# Patient Record
Sex: Female | Born: 1940 | Race: White | Hispanic: No | Marital: Single | Smoking: Current every day smoker
Health system: Southern US, Community
[De-identification: ages and names within clinical notes are randomized; demographics above are authoritative.]

## PROBLEM LIST (undated history)

## (undated) DIAGNOSIS — D649 Anemia, unspecified: Secondary | ICD-10-CM

## (undated) DIAGNOSIS — I10 Essential (primary) hypertension: Secondary | ICD-10-CM

## (undated) DIAGNOSIS — I50810 Right heart failure, unspecified: Secondary | ICD-10-CM

## (undated) DIAGNOSIS — R001 Bradycardia, unspecified: Secondary | ICD-10-CM

## (undated) DIAGNOSIS — E049 Nontoxic goiter, unspecified: Secondary | ICD-10-CM

## (undated) DIAGNOSIS — Z720 Tobacco use: Secondary | ICD-10-CM

## (undated) DIAGNOSIS — C539 Malignant neoplasm of cervix uteri, unspecified: Secondary | ICD-10-CM

## (undated) DIAGNOSIS — I358 Other nonrheumatic aortic valve disorders: Secondary | ICD-10-CM

## (undated) DIAGNOSIS — M4046 Postural lordosis, lumbar region: Secondary | ICD-10-CM

## (undated) DIAGNOSIS — M4800 Spinal stenosis, site unspecified: Secondary | ICD-10-CM

## (undated) DIAGNOSIS — N1330 Unspecified hydronephrosis: Secondary | ICD-10-CM

## (undated) DIAGNOSIS — J449 Chronic obstructive pulmonary disease, unspecified: Secondary | ICD-10-CM

## (undated) DIAGNOSIS — I272 Pulmonary hypertension, unspecified: Secondary | ICD-10-CM

## (undated) HISTORY — PX: OTHER SURGICAL HISTORY: SHX169

## (undated) HISTORY — PX: ORIF ELBOW FRACTURE: SUR928

## (undated) HISTORY — PX: COLOSTOMY: SHX63

## (undated) HISTORY — PX: EYE SURGERY: SHX253

## (undated) HISTORY — PX: LAPAROTOMY: SHX154

---

## 2007-08-30 ENCOUNTER — Ambulatory Visit: Payer: Self-pay

## 2007-09-04 ENCOUNTER — Ambulatory Visit: Payer: Self-pay

## 2007-09-06 ENCOUNTER — Ambulatory Visit: Payer: Self-pay

## 2007-09-18 ENCOUNTER — Ambulatory Visit: Payer: Self-pay

## 2007-09-20 ENCOUNTER — Ambulatory Visit: Payer: Self-pay

## 2007-09-25 ENCOUNTER — Ambulatory Visit: Payer: Self-pay

## 2007-09-27 ENCOUNTER — Ambulatory Visit: Payer: Self-pay

## 2010-05-18 ENCOUNTER — Emergency Department
Admit: 2010-05-18 | Discharge: 2010-05-18 | Disposition: A | Discharge status: Home / Self Care | Payer: Self-pay | Admitting: Physician Assistant

## 2010-05-24 ENCOUNTER — Ambulatory Visit: Admission: RE | Admit: 2010-05-24 | Payer: Self-pay | Source: Ambulatory Visit | Admitting: EXTERNAL

## 2013-06-16 ENCOUNTER — Emergency Department (EMERGENCY_DEPARTMENT_HOSPITAL): Payer: Medicare Other | Admitting: UHP RADIOLOGY

## 2013-06-16 ENCOUNTER — Emergency Department (EMERGENCY_DEPARTMENT_HOSPITAL): Payer: Medicare Other

## 2013-06-16 ENCOUNTER — Emergency Department
Admission: EM | Admit: 2013-06-16 | Discharge: 2013-06-16 | Disposition: A | Discharge status: Other Acute Inpatient Hospital | Payer: Medicare Other | Attending: Emergency Medicine | Admitting: Emergency Medicine

## 2013-06-16 DIAGNOSIS — I10 Essential (primary) hypertension: Secondary | ICD-10-CM | POA: Insufficient documentation

## 2013-06-16 DIAGNOSIS — Z862 Personal history of diseases of the blood and blood-forming organs and certain disorders involving the immune mechanism: Secondary | ICD-10-CM | POA: Insufficient documentation

## 2013-06-16 DIAGNOSIS — R55 Syncope and collapse: Secondary | ICD-10-CM | POA: Insufficient documentation

## 2013-06-16 DIAGNOSIS — E8779 Other fluid overload: Secondary | ICD-10-CM | POA: Insufficient documentation

## 2013-06-16 HISTORY — DX: Anemia, unspecified: D64.9

## 2013-06-16 HISTORY — DX: Essential (primary) hypertension: I10

## 2013-06-16 LAB — COMPREHENSIVE METABOLIC PROFILE - BMC/JMC ONLY
ALBUMIN/GLOBULIN RATIO: 1.8
ALBUMIN: 3.6 g/dL (ref 3.5–5.0)
ALKALINE PHOSPHATASE: 75 IU/L (ref 38–126)
ALT (SGPT): 34 IU/L (ref 14–54)
ANION GAP: 5 mmol/L (ref 3–11)
AST (SGOT): 25 IU/L (ref 15–41)
BILIRUBIN, TOTAL: 1.2 mg/dL (ref 0.3–1.2)
BUN: 20 mg/dL (ref 6–20)
CALCIUM: 8.6 mg/dL — ABNORMAL LOW (ref 8.8–10.2)
CARBON DIOXIDE: 28 mmol/L (ref 22–32)
CHLORIDE: 106 mmol/L (ref 101–111)
CREATININE: 0.92 mg/dL (ref 0.44–1.00)
ESTIMATED GLOMERULAR FILTRATION RATE: 60 mL/min (ref >60)
GLUCOSE: 106 mg/dL (ref 70–110)
POTASSIUM: 4.9 mmol/L (ref 3.4–5.1)
SODIUM: 139 mmol/L (ref 136–145)
TOTAL PROTEIN: 5.5 g/dL — ABNORMAL LOW (ref 6.4–8.3)

## 2013-06-16 LAB — URINALYSIS - JMC ONLY
BILIRUBIN,URINE: NEGATIVE mg/dL
BLOOD,URINE: NEGATIVE
GLUCOSE,URINE: NORMAL mg/dL
KETONE, URINE: NEGATIVE mg/dL
LEUKOCYTE ESTERACE,URINE: NEGATIVE
NITRITES,URINE: NEGATIVE
PH,URINE: 6 (ref 5.0–7.5)
PROTEIN,URINE: NEGATIVE mg/dL
SPECIFIC GRAVITY,URINE: 1.005 (ref 1.005–1.020)
UROBILINOGEN,URINE: NORMAL mg/dL (ref 0.2–1.0)

## 2013-06-16 LAB — TYPE AND SCREEN - BMC/JMC ONLY
ABO/RH(D): AB POS
ANTIBODY SCREEN: NEGATIVE

## 2013-06-16 LAB — CBC
HCT: 24 % — ABNORMAL LOW (ref 36.0–48.0)
HGB: 6.6 g/dL — CL (ref 12.0–16.0)
MCH: 23 pg — ABNORMAL LOW (ref 28.3–34.3)
MCHC: 27.5 g/dL — ABNORMAL LOW (ref 32.0–36.0)
MCV: 83.4 fL (ref 82.0–100.0)
MPV: 9.2 fL (ref 7.4–10.45)
NRBC ABSOLUTE: 0.02 10*3/uL (ref 0–0.02)
NRBC: 0.1 /100{WBCs} (ref 0–0.3)
PLATELET COUNT: 343 K/uL (ref 150–400)
RBC: 2.88 M/uL — ABNORMAL LOW (ref 4.0–5.6)
RDW: 20.6 % — ABNORMAL HIGH (ref 11.0–16.0)
WBC: 12.9 10*3/uL — ABNORMAL HIGH (ref 4.0–11.0)

## 2013-06-16 LAB — MANUAL DIFFERENTIAL - BMC/JMC ONLY
BANDS: 4 % (ref 0–4)
BASOPHILS: 5 % — ABNORMAL HIGH (ref 0–2.5)
EOSINOPHIL: 2 % (ref 0.0–5.2)
LYMPHOCYTES: 4 % — ABNORMAL LOW (ref 15.0–43.0)
METAMYELOCYTES: 1 % (ref 0–1)
MONOCYTES: 3 % — ABNORMAL LOW (ref 4.8–12.0)
PMN'S: 81 % — ABNORMAL HIGH (ref 43.0–76.0)

## 2013-06-16 LAB — TROPONIN-I: TROPONIN-I: <0.02 ng/mL — ABNORMAL LOW (ref 0.02–0.06)

## 2013-06-16 LAB — PT/INR
INR NORMALIZED: 1.06
PROTHROMBIN TIME: 11.2 s — ABNORMAL HIGH (ref 9.8–11.1)

## 2013-06-16 LAB — RBC MORPHOLOGY - JMC ONLY: PLATELET ESTIMATE: ADEQUATE

## 2013-06-16 LAB — PTT (PARTIAL THROMBOPLASTIN TIME): APTT: 27.6 s (ref 23.3–30.0)

## 2013-06-16 LAB — B-TYPE NATRIURETIC PEPTIDE: B-TYPE NATRIURETIC PEPTIDE: 1641 pg/mL — ABNORMAL HIGH (ref 0–99)

## 2013-06-16 LAB — D-DIMER: D-DIMER (QUANT): 0.81 mg/L FEU — ABNORMAL HIGH (ref 0.19–0.53)

## 2013-06-16 MED ORDER — SODIUM CHLORIDE 0.9 % IV BOLUS
1000.0000 mL | INJECTION | Freq: Once | Status: DC
Start: 2013-06-16 — End: 2013-06-16
  Administered 2013-06-16: 0 mL via INTRAVENOUS
  Administered 2013-06-16: 1000 mL via INTRAVENOUS

## 2013-06-16 MED ORDER — FUROSEMIDE 10 MG/ML INJECTION SOLUTION
20.00 mg | INTRAMUSCULAR | Status: AC
Start: 2013-06-16 — End: 2013-06-16
  Administered 2013-06-16: 20 mg via INTRAVENOUS
  Filled 2013-06-16: qty 2

## 2013-06-16 MED ORDER — SODIUM CHLORIDE 0.9 % (FLUSH) INJECTION SYRINGE
2.5000 mL | INJECTION | INTRAMUSCULAR | Status: DC | PRN
Start: 2013-06-16 — End: 2013-06-16
  Filled 2013-06-16: qty 5

## 2013-06-16 MED ADMIN — sodium chloride 0.9 % (flush) injection syringe: 2.5 mL | INTRAVENOUS | NDC 63807010030

## 2013-06-16 NOTE — Ancillary Notes (Cosign Needed)
 Called ER back regarding admission, as labs ordered, but not resulted.  Will evaluate patient when workup complete.

## 2013-06-16 NOTE — ED Nurses Note (Signed)
 BSC placed in room. Daughter to remain at bedside. Pt assisted to [facility] w/o difficulty. Call bell within reach.

## 2013-06-16 NOTE — ED Nurses Note (Signed)
Pt departed facility for [facility] by [facility] per R. Chilton Si, Charity fundraiser.

## 2013-06-16 NOTE — ED Nurses Note (Signed)
Crackles LLL, IVF d/c'd , will inform provider.

## 2013-06-16 NOTE — ED Nurses Note (Signed)
Report called to Aldean Baker at [facility]. Pt and family aware of transfer and room assignment.

## 2013-06-16 NOTE — ED Nurses Note (Signed)
Lab work reviewed with pt and family. Questions answered.

## 2013-06-16 NOTE — ED Nurses Note (Signed)
 Pt HR 30-40's when falling asleep. This RN at bedside to assess. Now awake with HR in 60's. Plan to consult cardiology.

## 2013-06-16 NOTE — ED Nurses Note (Signed)
Pt brought in by family; pt was reportedly passing out at home residence. Pt ambulated to room without difficulty. During Neuro assessment pt began to seize. Provider called. Seizure lasted 10 seconds in which patient returned to normal state. Non post ictal at present. Lung fields with rhonchi throughout. Edema plus three to legs.

## 2013-06-16 NOTE — ED Nurses Note (Signed)
Plan for [facility] to see.

## 2013-06-16 NOTE — ED Provider Notes (Signed)
[facility]  Emergency Department     HISTORY OF PRESENT ILLNESS     Date:  06/16/2013  Patient's Name:  Katrina Reilly  Date of Birth:  30-Mar-1941    Patient is a 72 y.o. female presenting with reason unable to perform ROS.   History provided by:  Patient and relative  Other  Quality:  Syncopal Episode  Severity:  Mild  Onset quality:  Sudden  Duration:  1 hour  Timing:  Constant  Progression:  Unchanged  Chronicity:  Recurrent  Associated symptoms: no abdominal pain, no chest pain, no congestion, no diarrhea, no fever, no headaches, no nausea, no rash, no shortness of breath, no sore throat and no vomiting      72 Y/O FEMALE PRESENTING TO THE ED WITH REPORTS OF THREE SYNCOPAL EPISODES OVER THE PAST TWO WEEKS. PT'S FAMILY MEMBER REPORTS HER THIRD SYNCOPAL EPISODE WAS TODAY PTA. PT'S FAMILY MEMBER REPORTS SHE WITNESSED TODAY'S SYNCOPAL EPISODE, BUT NOT THE FIRST TWO. PT DENIES HITTING HER HEAD. PT'S FAMILY MEMBER REPORTS SHE HAS A HARD TIME STAYING AWAKE AS WELL. PT DENIES CP, DIZZINESS, OR ANY OTHER SYMPTOMS PRIOR TO HER SYNCOPAL EPISODES IN THE PAST. PT STS SHE DID FEEL DIZZINESS TODAY PRIOR TO SYNCOPE, BUT STS SHE WAS BENDING OVER PULLING WEEDS. PT REPORTS SHE CLENCHED HER FISTS PRIOR TO SYNCOPE EPISODE AS WELL PER HER SON. PT DID SEIZE IN ED FOR A FEW SECONDS TODAY. PT REPORTS SHE IS BACK AT BASELINE IN THE ED. PT HAS HX OF ANEMIA.     Review of Systems     Review of Systems   Constitutional: Negative for fever, chills and diaphoresis.   HENT: Negative for congestion, sore throat and neck pain.    Respiratory: Negative for shortness of breath.    Cardiovascular: Negative for chest pain and palpitations.   Gastrointestinal: Negative for nausea, vomiting, abdominal pain and diarrhea.   Genitourinary: Negative for dysuria.   Musculoskeletal: Negative for back pain.   Skin: Negative for rash.   Neurological: Positive for syncope. Negative for numbness and headaches.    Psychiatric/Behavioral: Negative for confusion.   All other systems reviewed and are negative.        Previous History     Past Medical History:  Past Medical History   Diagnosis Date   . HTN (hypertension)    . Anemia        Past Surgical History:  No past surgical history on file.    Social History:  History   Substance Use Topics   . Smoking status: Not on file   . Smokeless tobacco: Not on file   . Alcohol Use: Not on file     History   Drug Use Not on file       Family History:  No family history on file.    Medication History:  Current Outpatient Prescriptions   Medication Sig   . Hydroxyurea 200 mg Oral Capsule Take by mouth   . metoprolol (LOPRESSOR) 25 mg Oral Tablet Take 25 mg by mouth Twice daily       Allergies:  Allergies   Allergen Reactions   . Sulfa (Sulfonamides)        Physical Exam     Vitals:    BP 152/86   Pulse 54   Temp(Src) 36.8 C (98.3 F)   Resp 17   Ht 1.524 m (5')   Wt 129.729 kg (286 lb)   BMI 55.86 kg/m2  SpO2 98%    Physical Exam   Nursing note and vitals reviewed.  Constitutional:  Well developed, well nourished.  Awake & alert. No distress.  Head:  Obvious ecchymosis to left cheek.  Normocephalic.    Eyes:  PERRL.  EOMI.  Conjunctivae are not pale.  ENT:  Mucous membranes are dry and intact.  Oropharynx is clear and symmetric.  Patent airway.  Neck:  Supple.  Full ROM.  No JVD.  No lymphadenopathy.  Cardiovascular:  Regular rate.  Regular rhythm.  No murmurs, rubs, or gallops.  Distal pulses are 2+ and symmetric.  Pulmonary/Chest:  No evidence of respiratory distress.  Clear to auscultation bilaterally.  No wheezing, rales or rhonchi. Chest non-tender.  Abdominal:  Soft and non-distended.  There is no tenderness.  No rebound, guarding, or rigidity.  No organomegaly.  Good bowel sounds.    Back:  No CVA tenderness. FROM.   Extremities:  3+ pedem edema in bilateral lower legs.  No cyanosis.  No clubbing.  Full range of motion in all extremities.  No calf tenderness.   Skin:  Skin is warm and dry.  No diaphoresis. No rash.   Neurological:  Alert, awake, and appropriate.  Normal speech.  Sensation normal. Motor strengths 5/5. CN II-XII intact.   Psychiatric:  Good eye contact.  Normal interaction, affect, and behavior.    Diagnostic Studies/Treatment     Medications:  Medications   NS flush syringe (2.5 mL Intravenous Given 06/16/13 1502)   furosemide (LASIX) 10 mg/mL injection (20 mg Intravenous Given 06/16/13 1502)       New Prescriptions    No medications on file       Labs:    Results for orders placed during the hospital encounter of 06/16/13   CBC       Result Value Range    WBC 12.9 (*) 4.0 - 11.0 K/uL    RBC 2.88 (*) 4.0 - 5.6 M/uL    HGB 6.6 (*) 12.0 - 16.0 g/dL    HCT 16.1 (*) 09.6 - 48.0 %    MCV 83.4  82.0 - 100.0 fL    MCH 23.0 (*) 28.3 - 34.3 pg    MCHC 27.5 (*) 32.0 - 36.0 g/dL    RDW 04.5 (*) 40.9 - 16.0 %    PLATELET COUNT 343  150 - 400 K/uL    MPV 9.2  7.4 - 10.45 fL    NRBC 0.1  0 - 0.3 /100 WBC    NRBC ABSOLUTE 0.02  0 - 0.02 K/uL   COMPREHENSIVE METABOLIC PROFILE - [facility] ONLY       Result Value Range    GLUCOSE 106  70 - 110 mg/dL    BUN 20  6 - 20 mg/dL    CREATININE 8.11  9.14 - 1.00 mg/dL    ESTIMATED GLOMERULAR FILTRATION RATE 60  >60 ml/min    SODIUM 139  136 - 145 mmol/L    POTASSIUM 4.9  3.4 - 5.1 mmol/L    CHLORIDE 106  101 - 111 mmol/L    CARBON DIOXIDE 28  22 - 32 mmol/L    ANION GAP 5  3 - 11 mmol/L    CALCIUM 8.6 (*) 8.8 - 10.2 mg/dL    TOTAL PROTEIN 5.5 (*) 6.4 - 8.3 g/dL    ALBUMIN 3.6  3.5 - 5.0 g/dL    ALBUMIN/GLOBULIN RATIO 1.8      BILIRUBIN, TOTAL 1.2  0.3 - 1.2 mg/dL  AST (SGOT) 25  15 - 41 IU/L    ALT (SGPT) 34  14 - 54 IU/L    ALKALINE PHOSPHATASE 75  38 - 126 IU/L   TROPONIN-I       Result Value Range    TROPONIN-I <0.02 (*) 0.02 - 0.06 ng/mL   PT/INR       Result Value Range    PROTHROMBIN TIME 11.2 (*) 9.8 - 11.1 sec    INR NORMALIZED 1.06     PTT (PARTIAL THROMBOPLASTIN TIME)       Result Value Range    APTT 27.6  23.3 - 30.0 sec    D-DIMER       Result Value Range    D-DIMER (QUANT) 0.81 (*) 0.19 - 0.53 mg/L FEU   B-TYPE NATRIURETIC PEPTIDE       Result Value Range    B-TYPE NATRIURETIC PEPTIDE 1641 (*) 0 - 99 pg/mL   RBC MORPHOLOGY - [facility] ONLY       Result Value Range    PLATELET ESTIMATE ADEQUATE  ADEQUATE    HYPO 3+      POIKILOCYTOSIS 4+      BASOPHILIC STIPPILING 2+      ANISOCYTOSIS 4+      MICROCYTOSIS 3+      MACROCYTOSIS 1+      SCHISTOCYTES 1+      TARGET CELLS 1+      TEAR DROPS 1+      OVALOCYTES 1+      STOMATOCYTES 1+     MANUAL DIFFERENTIAL - [facility] ONLY       Result Value Range    METAMYELOCYTES 1  0 - 1 %    BANDS 4  0 - 4 %    PMN'S 81 (*) 43.0 - 76.0 %    LYMPHOCYTES 4 (*) 15.0 - 43.0 %    MONOCYTES 3 (*) 4.8 - 12.0 %    EOSINOPHIL 2  0.0 - 5.2 %    BASOPHILS 5 (*) 0 - 2.5 %   URINALYSIS - [facility] ONLY       Result Value Range    SOURCE, URINE CC      COLOR, URINE yellow  YELLOW    APPEARANCE,URINE clear  CLEAR    GLUCOSE,URINE normal  NEGATIVE mg/dL    BILIRUBIN,URINE negative  NEGATIVE mg/dL    KETONE, URINE negative  NEGATIVE mg/dL    SPECIFIC GRAVITY,URINE 1.005  1.005 - 1.020    BLOOD,URINE negative  NEGATIVE    PH,URINE 6.0  5.0 - 7.5    PROTEIN,URINE negative  NEGATIVE mg/dL    UROBILINOGEN,URINE normal  0.2 - 1.0 mg/dL    NITRITES,URINE negative  NEGATIVE    LEUKOCYTE ESTERACE,URINE negative  NEGATIVE   TYPE AND SCREEN - [facility] ONLY       Result Value Range    ABO/RH(D) AB POSITIVE      ANTIBODY SCREEN NEGATIVE         Radiology:  CT BRAIN WO IV CONTRAST  XR CHEST AP PORTABLE    CT BRAIN WO IV CONTRAST    ED Interpretation: Normal for age       XR CHEST AP PORTABLE    (Results Pending)   Imaging Studies: Imaging studies were ordered. Results contemporaneously interpreted by me:   XR CHEST: cardiomegaly      ECG:  EKG: The emergency physician ordered, reviewed, and independently interpreted the EKG. Dr. Marisa Sprinkles  Time Interpreted: 14:22  Rate:  65 bpm  Rhythm: NSR   Interpretation: Possible left atrial enlargement. Borderline EKG. No STEMI.     Cardiac Monitoring: 65 bpm    Procedure     Procedures    Course/Disposition/Plan     Course:    Concern for syncope noted and patient also having symptomatic bradycardia here as well.  There is concern for her elevated BNP as well and a CHF exacerbation. Her Hb is low but it is always low per the patient. D-dimer mildly elevated and will have to defer further scanning until her next facility at this time.  Overall presentation seems less likely to be a PE.  Patient transferred to [facility] for further eval. I spoke with Dr. Pecola Leisure of [facility] who recommended transfer to the hospitalist service.    Disposition:   Transfered to Another Facility    Follow up:   No follow-up provider specified.    Clinical Impression:     Encounter Diagnoses   Name Primary?   . Syncope Yes   . Fluid overload        Future Appointments Scheduled in Epic:  No future appointments.    SCRIBE ATTESTATION   This note is prepared by Pennie Banter, acting as Scribe for Dr. Marisa Sprinkles    The scribe's documentation has been prepared under my direction and personally reviewed by me in its entirety.  I confirm that the note above accurately reflects all work, treatment, procedures, and medical decision making performed by me, Dr. Marisa Sprinkles

## 2014-04-14 ENCOUNTER — Emergency Department
Admission: EM | Admit: 2014-04-14 | Discharge: 2014-04-14 | Disposition: A | Discharge status: Home / Self Care | Payer: Medicare Other | Attending: Emergency Medicine | Admitting: Emergency Medicine

## 2014-04-14 ENCOUNTER — Emergency Department (EMERGENCY_DEPARTMENT_HOSPITAL): Payer: Medicare Other

## 2014-04-14 DIAGNOSIS — S8000XA Contusion of unspecified knee, initial encounter: Secondary | ICD-10-CM | POA: Insufficient documentation

## 2014-04-14 DIAGNOSIS — S8002XA Contusion of left knee, initial encounter: Secondary | ICD-10-CM

## 2014-04-14 DIAGNOSIS — Y9301 Activity, walking, marching and hiking: Secondary | ICD-10-CM | POA: Insufficient documentation

## 2014-04-14 DIAGNOSIS — D649 Anemia, unspecified: Secondary | ICD-10-CM | POA: Insufficient documentation

## 2014-04-14 DIAGNOSIS — W010XXA Fall on same level from slipping, tripping and stumbling without subsequent striking against object, initial encounter: Secondary | ICD-10-CM

## 2014-04-14 DIAGNOSIS — I10 Essential (primary) hypertension: Secondary | ICD-10-CM | POA: Insufficient documentation

## 2014-04-14 DIAGNOSIS — F17200 Nicotine dependence, unspecified, uncomplicated: Secondary | ICD-10-CM | POA: Insufficient documentation

## 2014-04-14 MED ORDER — HYDROCODONE 5 MG-ACETAMINOPHEN 325 MG TABLET
ORAL_TABLET | ORAL | Status: DC
Start: 2014-04-14 — End: 2014-04-14
  Filled 2014-04-14: qty 1

## 2014-04-14 MED ORDER — HYDROCODONE 5 MG-ACETAMINOPHEN 325 MG TABLET
1.00 | ORAL_TABLET | ORAL | Status: AC
Start: 2014-04-14 — End: 2014-04-14
  Administered 2014-04-14: 1 via ORAL

## 2014-04-14 MED ORDER — HYDROCODONE 5 MG-ACETAMINOPHEN 325 MG TABLET
1.00 | ORAL_TABLET | Freq: Four times a day (QID) | ORAL | Status: AC | PRN
Start: 2014-04-14 — End: ?

## 2014-04-14 MED ADMIN — HYDROcodone 5 mg-acetaminophen 325 mg tablet: ORAL | @ 13:00:00

## 2014-04-14 NOTE — ED Nurses Note (Signed)
Left knee wrapped with ace wrap per order. Pt tolerated without complaint.Patient discharged home with family.  AVS reviewed with patient/care giver.  A written copy of the AVS and discharge instructions was given to the patient/care giver.  Questions sufficiently answered as needed.  Patient/care giver encouraged to follow up with PCP as indicated.  In the event of an emergency, patient/care giver instructed to call 911 or go to the nearest emergency room. Ambulatory home with family, questions denied.

## 2014-04-14 NOTE — ED Provider Notes (Signed)
[facility]  Emergency Department     HISTORY OF PRESENT ILLNESS     Date:  04/14/2014  Patient's Name:  Katrina Reilly  Date of Birth:  1941-02-12    Patient is a 73 y.o. female presenting with fall.   History provided by:  Patient  Fall  The accident occurred 1 to 2 hours ago. The fall occurred while walking. She fell from a height of 1 to 2 ft. She landed on carpet. There was no blood loss. The point of impact was the left knee. The pain is present in the left knee. The pain is at a severity of 8/10. The pain is moderate. Pertinent negatives include no fever, no abdominal pain, no nausea, no vomiting, no headaches and no loss of consciousness.        72 Y/O FEMALE PRESENTS TO ED AFTER A FALL. THE PT REPORTS THAT SHE TRIPPED AND FELL WHILE WALKING TO THE BATHROOM THIS MORNING. THE PT STS THAT SHE LANDED ONTO HER KNEES BEFORE FALLING FORWARD ONTO HER ARMS. THE PT STS THAT SHE HAS HAD PAIN IN HER LEFT KNEE SINCE FALLING. THE PT STS THAT HER PAIN IS EXACERBATED WITH WEIGHT BEARING. THE PT DENIES ANY HEAD INJURY, LOC, OR OTHER ASSOCIATED INJURIES.     Review of Systems     Review of Systems   Constitutional: Negative for fever and diaphoresis.   HENT: Negative for congestion and sore throat.    Respiratory: Negative for cough and shortness of breath.    Cardiovascular: Negative for chest pain.   Gastrointestinal: Negative for nausea, vomiting, abdominal pain and diarrhea.   Musculoskeletal: Positive for myalgias. Negative for back pain and neck pain.   Skin: Negative for rash.   Neurological: Negative for loss of consciousness, syncope and headaches.   Psychiatric/Behavioral: Negative for confusion.   All other systems reviewed and are negative.        Previous History     Past Medical History:  Past Medical History   Diagnosis Date    HTN (hypertension)     Anemia        Past Surgical History:  History reviewed. No pertinent past surgical history.    Social History:  History      Substance Use Topics    Smoking status: Current Every Day Smoker     Types: Cigarettes    Smokeless tobacco: Not on file    Alcohol Use: Yes      Comment: social     History   Drug Use No       Family History:  No family history on file.    Medication History:  Current Outpatient Prescriptions   Medication Sig    HYDROcodone-acetaminophen (NORCO) 5-325 mg Oral Tablet Take 1 Tab by mouth Every 6 hours as needed for Pain    Hydroxyurea 200 mg Oral Capsule Take by mouth       Allergies:  Allergies   Allergen Reactions    Sulfa (Sulfonamides)        Physical Exam     Vitals:    BP 167/77    Pulse 90    Temp(Src) 36.6 C (97.9 F)    Resp 18    Ht 1.575 m (5' 2.01")    Wt 113.399 kg (250 lb)    BMI 45.71 kg/m2      SpO2 96%     Physical Exam   Nursing note and vitals reviewed.    Constitutional:  Well  developed, well nourished.  Awake & alert. No distress.  Head:  Atraumatic.  Normocephalic.    Cardiovascular:  Regular rate.  Regular rhythm.  No murmurs, rubs, or gallops.  Distal pulses are 2+ and symmetric.  Pulmonary/Chest:  No evidence of respiratory distress.  Clear to auscultation bilaterally.  No wheezing, rales or rhonchi. Chest non-tender.  Abdominal:  Soft and non-distended.  There is no tenderness.  No rebound, guarding, or rigidity.  No organomegaly.  Good bowel sounds.    Back:  No CVA tenderness. FROM.   Extremities:  No edema.   No cyanosis.  No clubbing.  Full range of motion in all extremities.  No calf tenderness. Pain to palpation of the left knee with ecchymosis and edema. No deformity.   Skin:  Skin is warm and dry.  No diaphoresis. No rash.   Neurological:  Alert, awake, and appropriate.  Normal speech.  Sensation normal.   Psychiatric:  Good eye contact.  Normal interaction, affect, and behavior.  Diagnostic Studies/Treatment     Medications:  Medications   HYDROcodone-acetaminophen (NORCO) 5-325 mg per tablet (1 Tab Oral Given 04/14/14 1257)       New Prescriptions     HYDROCODONE-ACETAMINOPHEN (NORCO) 5-325 MG ORAL TABLET    Take 1 Tab by mouth Every 6 hours as needed for Pain       Labs:    No results found for any visits on 04/14/14.    Radiology:  XR KNEE LEFT AP, LAT & 1 OBLIQUE    XR KNEE LEFT AP, LAT & 1 OBLIQUE    (Results Pending)   Imaging Studies: Imaging studies were ordered. Results contemporaneously interpreted by me:   XR Left knee: Soft tissue hematoma. No fracture or dislocation.     ECG:  NONE      Procedure     Procedures    Course/Disposition/Plan     Course:    No fx, placed in acewrap, given Hydrocodone, refused crutches.    Disposition:    Discharged    Follow up:   [facility]  [address]  [place] [id number]  [phone]    Schedule an appointment as soon as possible for a visit  As needed      Clinical Impression:     Encounter Diagnosis   Name Primary?    Contusion of left knee Yes       Future Appointments Scheduled in Epic:  No future appointments.     SCRIBE ATTESTATION   This note is prepared by Luvenia HellerSamantha Chandler, acting as Scribe for Dr. Ramiro HarvestParikh    The scribe's documentation has been prepared under my direction and personally reviewed by me in its entirety.  I confirm that the note above accurately reflects all work, treatment, procedures, and medical decision making performed by me, Dr. Ramiro HarvestParikh

## 2014-04-14 NOTE — ED Nurses Note (Signed)
Pt was ambulating to restroom this am, tripped and fell.  Pain to left lower leg, minor swelling noted to bottom of leg.  Bruising to knee.

## 2014-04-16 ENCOUNTER — Ambulatory Visit (INDEPENDENT_AMBULATORY_CARE_PROVIDER_SITE_OTHER): Payer: Medicare Other

## 2014-04-16 ENCOUNTER — Encounter (INDEPENDENT_AMBULATORY_CARE_PROVIDER_SITE_OTHER): Payer: Self-pay

## 2014-04-16 VITALS — BP 142/74 | HR 68 | Temp 98.4°F | Wt 179.0 lb

## 2014-04-16 DIAGNOSIS — L0291 Cutaneous abscess, unspecified: Secondary | ICD-10-CM

## 2014-04-16 DIAGNOSIS — L0390 Cellulitis, unspecified: Secondary | ICD-10-CM

## 2014-04-16 DIAGNOSIS — S8000XA Contusion of unspecified knee, initial encounter: Secondary | ICD-10-CM

## 2014-04-16 MED ORDER — CEPHALEXIN 500 MG CAPSULE
500.00 mg | ORAL_CAPSULE | Freq: Two times a day (BID) | ORAL | Status: AC
Start: 2014-04-16 — End: 2014-04-23

## 2014-04-16 NOTE — Progress Notes (Signed)
BP 142/74   Pulse 68   Temp(Src) 36.9 C (98.4 F) (Oral)   Wt 81.194 kg (179 lb)   Katrina Reilly, RTR  04/16/2014, 08:27

## 2014-04-16 NOTE — Progress Notes (Signed)
ID: [name]   DOB: 1940-11-10  Date of Service: 04/16/2014     Chief Complaint(s):   Chief Complaint   Patient presents with    Blister     left knee    Bruising     left knee       Subjective:      Fall on left knee on Monday, seen in ED.  Xray negative but since that time has had a large vesicle form on the anterior patella and she states has gotten red and hot.  Still with pain.  No fever.  No systemic symptoms.  Allergy to sulfa.  Tolerating po.  Ambulating with a cane    Current Outpatient Prescriptions   Medication Sig    cephALEXin (KEFLEX) 500 mg Oral Capsule Take 1 Cap (500 mg total) by mouth Twice daily for 7 days    furosemide (LASIX) 40 mg Oral Tablet Take 40 mg by mouth Once a day    HYDROcodone-acetaminophen (NORCO) 5-325 mg Oral Tablet Take 1 Tab by mouth Every 6 hours as needed for Pain    Hydroxyurea 200 mg Oral Capsule Take by mouth       Allergies   Allergen Reactions    Sulfa (Sulfonamides)        No family history on file.    Active Ambulatory Problems     Diagnosis Date Noted    No Active Ambulatory Problems     Resolved Ambulatory Problems     Diagnosis Date Noted    No Resolved Ambulatory Problems     Past Medical History   Diagnosis Date    HTN (hypertension)     Anemia        Social History        Objective:  Vitals Signs are stable   Filed Vitals:    04/16/14 0825   BP: 142/74   Pulse: 68   Temp: 36.9 C (98.4 F)   TempSrc: Oral   Weight: 81.194 kg (179 lb)       Gen:  no acute distress, non toxic looking, interactive  Extremity:  Erythema of the left knee as well as 8x8 cm ecchymosis with tenderness and warmth, large bullae with clear fluid on anterior knee/patella, mild increased warmth with some tenderness in the area of erythema.  The entire region appears to be mildly edematous as well.     Clinical Impression:           ICD-9-CM   1. Knee contusion 924.11   2. Cellulitis 682.9       Orders Placed This Encounter    cephALEXin (KEFLEX) 500 mg Oral Capsule                  Plan:   1)   Will cover with antibiotics as above. Medication given with warnings including yogurt and probiotic use for prophylaxis against C. difficile. Allergic reaction discussed. ICE vs heat discussed.  Return if worse at any time.     If symptoms worsen over the next 24-36 hours at any time they should proceed immediately to the emergency department as the injury may need IV antibiotics. Would expect to see some improvement over that time period     At this time I recommend symptomatic care, and Tylenol Motrin.    Follow up with us immediately if patient worsens or status changes significantly otherwise it should run benign course.     All questions were answered and patient agrees to plan  Level III

## 2014-04-16 NOTE — Patient Instructions (Signed)
Cellulitis  Cellulitis is an infection of the skin and the tissue beneath it. The infected area is usually red and tender. Cellulitis occurs most often in the arms and lower legs.   CAUSES   Cellulitis is caused by bacteria that enter the skin through cracks or cuts in the skin. The most common types of bacteria that cause cellulitis are staphylococci and streptococci.  SIGNS AND SYMPTOMS    Redness and warmth.   Swelling.   Tenderness or pain.   Fever.  DIAGNOSIS   Your health care provider can usually determine what is wrong based on a physical exam. Blood tests may also be done.  TREATMENT   Treatment usually involves taking an antibiotic medicine.  HOME CARE INSTRUCTIONS    Take your antibiotic medicine as directed by your health care provider. Finish the antibiotic even if you start to feel better.   Keep the infected arm or leg elevated to reduce swelling.   Apply a warm cloth to the affected area up to 4 times per day to relieve pain.   Take medicines only as directed by your health care provider.   Keep all follow-up visits as directed by your health care provider.  SEEK MEDICAL CARE IF:    You notice red streaks coming from the infected area.   Your red area gets larger or turns dark in color.   Your bone or joint underneath the infected area becomes painful after the skin has healed.   Your infection returns in the same area or another area.   You notice a swollen bump in the infected area.   You develop new symptoms.   You have a fever.  SEEK IMMEDIATE MEDICAL CARE IF:    You feel very sleepy.   You develop vomiting or diarrhea.   You have a general ill feeling (malaise) with muscle aches and pains.  MAKE SURE YOU:    Understand these instructions.   Will watch your condition.   Will get help right away if you are not doing well or get worse.  Document Released: 07/13/2005 Document Revised: 02/17/2014 Document Reviewed: 12/19/2011  [facility] Patient Information 2015 [employer] [place] [place].  This information is not intended to replace advice given to you by your health care provider. Make sure you discuss any questions you have with your health care provider.  [facility]  [address], [address]  [place], [place] [id number]  Phone: [phone]  Fax: [phone]               Open Daily 12:00pm - 8:00pm ~ Closed Thanksgiving and Christmas Day     Attending Caregiver: Providence CrosbyLloyd R Myrtha Tonkovich, MD      Today's orders:   Orders Placed This Encounter    cephALEXin (KEFLEX) 500 mg Oral Capsule        If you have been given and antibiotic, we recommend eating yogurt or taking PROBIOTIC pills to decrease the risk of antibiotic induced diarrhea and a resulting infection called C Difficile which can be dangerous/deadly.          Prescription(s) E-Rx to:  [id number] [place] [address]    ________________________________________________________________________  Short Term Disability and Family Medical Leave Act  [facility] does NOT provide assistance with any disability applications.  If you feel your medical condition requires you to be on disability, you will need to follow up with  Your primary care physician or a specialist.  We apologize  for any inconvenience.    For Medication Prescribed by [facility]:  As an Urgent Care facility, our clinic does NOT offer prescription refills over the telephone.    If you need more of the medication one of our medical providers prescribed, you will  Either need to be re-evaluated by us or see your primary care physician.    ________________________________________________________________________      It is very important that we have a phone number that is the single best way to contact you in the event that we become aware of important clinical information or concerns after your discharge.  If the phone number you provided at registration is NOT this number you should inform staff and registration prior to leaving.      Your  treatment and evaluation today was focused on identifying and treating potentially emergent conditions based on your presenting signs, symptoms, and history.  The resulting initial clinical impression and treatment plan is not intended to be definitive or a substitute for a full physical examination and evaluation by your primary care provider.  If your symptoms persist, worsen, or you develop any new or concerning symptoms, you need to be evaluated.      If you received x-rays during your visit, be aware that the final and formal interpretation of those films by a radiologist may occur after your discharge.  If there is a significant discrepancy identified after your discharge, we will contact you at the telephone number provided at registration.      If you received a pelvic exam, you may have cultures pending for sexually transmitted diseases.  Positive cultures are reported to the [facility] Department of Health as required by state law.  You should be contacted if you cultures are positive.  We will not contact you if they are negative.  You did NOT receive a PAP smear (the screening test for cervical).  This specific test for women is best performed by your gynecologist or primary care provider when indicated.      If you are over 75 year old, we cannot discuss your personal health information with a parent, spouse, family member, or anyone else without your express consent.  This does not include those who have legitimate access to your records and information to assist in your care under the provisions of HIPAA [facility](Health Insurance Portability and Accountability Act) law, or those to whom you have previously given express written consent to do so, such a legal guardian or Power of [place].      You may have received medication that may cause you to feel drowsy and/or light headed for several hours.  You may even experience some amnesia of your stay.  You should avoid operating a motor vehicle or performing any  activity requiring complete alertness or coordination until you feel fully awake (approximately 24-48 hours).  Avoid alcoholic beverages.  You may also have a dry mouth for several hours.  This is a normal side effect and will disappear as the effects of the medication wear off.      Instructions discussed with patient upon discharge by clinical staff with all questions answered.  Please call [facility] [phone]) if any further questions.  Go immediately to the emergency department if any concern or worsening symptoms.      Providence CrosbyLloyd R Chantee Cerino, MD 04/16/2014, 08:42

## 2014-04-17 ENCOUNTER — Encounter (INDEPENDENT_AMBULATORY_CARE_PROVIDER_SITE_OTHER): Payer: Self-pay

## 2014-04-17 NOTE — Progress Notes (Signed)
Patient does not answer telephone on courtesy call back.  alp

## 2015-02-02 DIAGNOSIS — K5720 Diverticulitis of large intestine with perforation and abscess without bleeding: Principal | ICD-10-CM

## 2015-02-02 NOTE — ED Notes (Signed)
Pt started having > 10/10 pain upon awakening this morning and went to [facility] ED, where she was diagnosed with a perforated bowel. They were not able to repair it surgically at that facility so she was sent here for further treatment and evaluation. Pt states the pain is mostly bilateral lower quadrants however it does radiant up. Pt states she had a BM last night of all diarrhea. Pt states today she had a bagel for breakfast however soon after she had nausea with emesis. Pt has diffuse abdominal tenderness. Bowel sounds normoactive in bilateral upper quadrants, and hypoactive bilateral lower quadrants. Pt is complaining of 10/10 pain currently. Pt is in mild distress from pain. Pt is supine in bed with HOB elevated approx 45* for comfort. Bed in lowest locked position, side rails x2 up, call light in reach. Pt connected to cardiac monitor/BP cuff/pulse oximeter.

## 2015-02-02 NOTE — ED Notes (Signed)
TRANSFER - OUT REPORT:    Verbal report given to Joletta RN(name) on Hurshel PartyLinda Bentz  being transferred to PCCU(unit) for routine progression of care       Report consisted of patient???s Situation, Background, Assessment and   Recommendations(SBAR).     Information from the following report(s) SBAR, ED Summary, Recent Results and Med Rec Status was reviewed with the receiving nurse.    Lines:   Peripheral IV 02/02/15 Left Forearm (Active)   Site Assessment Clean, dry, & intact 02/02/2015 10:18 PM   Phlebitis Assessment 0 02/02/2015 10:18 PM   Dressing Status Clean, dry, & intact 02/02/2015 10:18 PM   Dressing Type Transparent 02/02/2015 10:18 PM   Hub Color/Line Status Flushed;Patent 02/02/2015 10:18 PM       Peripheral IV 02/02/15 Left Antecubital (Active)   Site Assessment Clean, dry, & intact 02/02/2015 10:18 PM   Phlebitis Assessment 0 02/02/2015 10:18 PM   Infiltration Assessment 0 02/02/2015 10:18 PM   Dressing Status Clean, dry, & intact 02/02/2015 10:18 PM   Dressing Type Transparent 02/02/2015 10:18 PM   Hub Color/Line Status Flushed;Patent 02/02/2015 10:18 PM        Opportunity for questions and clarification was provided.      Patient transported with:   Monitor  O2 @ 2 liters  Registered Nurse

## 2015-02-03 ENCOUNTER — Inpatient Hospital Stay
Admit: 2015-02-03 | Discharge: 2015-02-10 | Disposition: A | Discharge status: Home Health | Payer: MEDICARE | Attending: Internal Medicine | Admitting: Internal Medicine

## 2015-02-03 LAB — METABOLIC PANEL, COMPREHENSIVE
ALT (SGPT): 15 U/L (ref 12–78)
AST (SGOT): 16 U/L (ref 15–37)
Albumin: 3.4 gm/dl (ref 3.4–5.0)
Alk. phosphatase: 79 U/L (ref 45–117)
BUN: 16 mg/dl (ref 7–25)
Bilirubin, total: 0.8 mg/dl (ref 0.2–1.0)
CO2: 25 mEq/L (ref 21–32)
Calcium: 7.9 mg/dl — ABNORMAL LOW (ref 8.5–10.1)
Chloride: 108 mEq/L — ABNORMAL HIGH (ref 98–107)
Creatinine: 1.2 mg/dl (ref 0.6–1.3)
GFR est AA: 56
GFR est non-AA: 47
Glucose: 134 mg/dl — ABNORMAL HIGH (ref 74–106)
Potassium: 4.5 mEq/L (ref 3.5–5.1)
Protein, total: 6.3 gm/dl — ABNORMAL LOW (ref 6.4–8.2)
Sodium: 141 mEq/L (ref 136–145)

## 2015-02-03 LAB — CBC W/O DIFF
HCT: 30.3 % — ABNORMAL LOW (ref 37.0–50.0)
HGB: 8.8 gm/dl — ABNORMAL LOW (ref 13.0–17.2)
MCH: 34.9 pg — ABNORMAL HIGH (ref 25.4–34.6)
MCHC: 29 gm/dl — ABNORMAL LOW (ref 30.0–36.0)
MCV: 120.2 fL — ABNORMAL HIGH (ref 80.0–98.0)
MPV: 11.8 fL — ABNORMAL HIGH (ref 6.0–10.0)
PLATELET: 435 10*3/uL (ref 140–450)
RBC: 2.52 M/uL — ABNORMAL LOW (ref 3.60–5.20)
RDW-SD: 80.8 — CR (ref 36.4–46.3)
WBC: 43 10*3/uL — ABNORMAL HIGH (ref 4.0–11.0)

## 2015-02-03 LAB — HGB & HCT
HCT: 28.6 % — ABNORMAL LOW (ref 37.0–50.0)
HCT: 28.9 % — ABNORMAL LOW (ref 37.0–50.0)
HCT: 27.8 % — ABNORMAL LOW (ref 37.0–50.0)
HGB: 8.3 gm/dl — ABNORMAL LOW (ref 13.0–17.2)
HGB: 8.3 gm/dl — ABNORMAL LOW (ref 13.0–17.2)
HGB: 7.9 gm/dl — ABNORMAL LOW (ref 13.0–17.2)

## 2015-02-03 LAB — IRON PROFILE
Iron % saturation: 2 % — ABNORMAL LOW (ref 20–45)
Iron: <5 ug/dL — ABNORMAL LOW (ref 50–170)
TIBC: 265 ug/dL (ref 250–450)

## 2015-02-03 LAB — LACTIC ACID
Lactic Acid: 1.1 mmol/L (ref 0.4–2.0)
Lactic Acid: 0.8 mmol/L (ref 0.4–2.0)

## 2015-02-03 LAB — GLUCOSE, POC: Glucose (POC): 141 mg/dL — ABNORMAL HIGH (ref 65–105)

## 2015-02-03 LAB — BLOOD TYPE, (ABO+RH)
ABO/Rh(D): AB POS
ABO/Rh: AB POS

## 2015-02-03 LAB — ANTIBODY SCREEN: Antibody screen: NEGATIVE

## 2015-02-03 MED ORDER — MORPHINE 4 MG/ML SYRINGE
4 mg/mL | INTRAMUSCULAR | Status: AC
Start: 2015-02-03 — End: 2015-02-02
  Administered 2015-02-03: 03:00:00 via INTRAVENOUS

## 2015-02-03 MED ORDER — ONDANSETRON (PF) 4 MG/2 ML INJECTION
4 mg/2 mL | Freq: Once | INTRAMUSCULAR | Status: AC
Start: 2015-02-03 — End: 2015-02-02
  Administered 2015-02-03: 03:00:00 via INTRAVENOUS

## 2015-02-03 MED ORDER — MORPHINE 4 MG/ML SYRINGE
4 mg/mL | INTRAMUSCULAR | Status: DC | PRN
Start: 2015-02-03 — End: 2015-02-04
  Administered 2015-02-04: 01:00:00 via INTRAVENOUS

## 2015-02-03 MED ORDER — ONDANSETRON (PF) 4 MG/2 ML INJECTION
4 mg/2 mL | INTRAMUSCULAR | Status: DC | PRN
Start: 2015-02-03 — End: 2015-02-10

## 2015-02-03 MED ORDER — SODIUM CHLORIDE 0.9 % IV
INTRAVENOUS | Status: AC
Start: 2015-02-03 — End: 2015-02-03

## 2015-02-03 MED ORDER — HYDROMORPHONE (PF) 1 MG/ML IJ SOLN
1 mg/mL | INTRAMUSCULAR | Status: DC | PRN
Start: 2015-02-03 — End: 2015-02-03

## 2015-02-03 MED ORDER — ALBUTEROL SULFATE 2.5 MG/0.5 ML NEB SOLUTION
2.5 mg/0.5 mL | RESPIRATORY_TRACT | Status: DC | PRN
Start: 2015-02-03 — End: 2015-02-10

## 2015-02-03 MED ORDER — SODIUM CHLORIDE 0.9 % IV
INTRAVENOUS | Status: DC
Start: 2015-02-03 — End: 2015-02-03
  Administered 2015-02-03: 03:00:00 via INTRAVENOUS

## 2015-02-03 MED ORDER — IPRATROPIUM BROMIDE 0.02 % SOLN FOR INHALATION
0.02 % | Freq: Four times a day (QID) | RESPIRATORY_TRACT | Status: DC | PRN
Start: 2015-02-03 — End: 2015-02-10

## 2015-02-03 MED ORDER — SODIUM CHLORIDE 0.9 % IV
INTRAVENOUS | Status: DC
Start: 2015-02-03 — End: 2015-02-05
  Administered 2015-02-03 – 2015-02-05 (×4): via INTRAVENOUS

## 2015-02-03 MED ORDER — ERTAPENEM 1 GRAM SOLUTION FOR INJECTION
1 gram | INTRAMUSCULAR | Status: DC
Start: 2015-02-03 — End: 2015-02-06
  Administered 2015-02-03 – 2015-02-06 (×4): via INTRAVENOUS

## 2015-02-03 MED ORDER — HYDROMORPHONE (PF) 1 MG/ML IJ SOLN
1 mg/mL | INTRAMUSCULAR | Status: DC | PRN
Start: 2015-02-03 — End: 2015-02-09
  Administered 2015-02-04 – 2015-02-08 (×13): via INTRAVENOUS

## 2015-02-03 MED ORDER — PANTOPRAZOLE 40 MG IV SOLR
40 mg | Freq: Two times a day (BID) | INTRAVENOUS | Status: DC
Start: 2015-02-03 — End: 2015-02-08
  Administered 2015-02-03 – 2015-02-08 (×11): via INTRAVENOUS

## 2015-02-03 MED FILL — MORPHINE 4 MG/ML SYRINGE: 4 mg/mL | INTRAMUSCULAR | Qty: 1

## 2015-02-03 MED FILL — SODIUM CHLORIDE 0.9 % IV: INTRAVENOUS | Qty: 1000

## 2015-02-03 MED FILL — INVANZ 1 GRAM SOLUTION FOR INJECTION: 1 gram | INTRAMUSCULAR | Qty: 1

## 2015-02-03 MED FILL — ONDANSETRON (PF) 4 MG/2 ML INJECTION: 4 mg/2 mL | INTRAMUSCULAR | Qty: 2

## 2015-02-03 MED FILL — PROTONIX 40 MG INTRAVENOUS SOLUTION: 40 mg | INTRAVENOUS | Qty: 40

## 2015-02-03 NOTE — Consults (Addendum)
[facility]  Colon and Rectal Surgery  [phone]    CONSULT NOTE    Today's date: February 03, 2015  Patient: Joanne Ruiz                 Sex: female        Date of Birth:  Dec 25, 1940      Age:  74 y.o.          DOA: 02/02/2015  LOS:  LOS: 1 day         Assessment/Plan     Active Problems:    Perforated viscus (02/02/2015)    1.  Perforated viscus, likely but not definitively diverticulitis.  Very very scant pneumoperitoneum on CT scan (several tiny bubbles.)  Hemodynamically stable.  Pain already improving with IV antibiotics and bowel rest.  No plan for urgent operative intervention- this is very likely to resolve non-operatively.  Continue IV abx (ertapenem appropriate), bowel rest, and judicious pain control.  Should eventually have colonoscopy as outpatient (~6 weeks)    2.  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  She sees a hematologist about twice a year for this, but doesn't remember who.  Will try to contact her PCP (Dr. Richardson Dopp in [place]) for more info, name of oncologist, and baseline CBC.  With ET there is a risk of leukemoid transformation as well as development of acquired Von Willebrand's- need to find out her current status- crucial information should surgery eventually become necessary.  May need hematology consult here.  ADDENDUM:    - Spoke with patient's PCP, Dr. Lavada Mesi [phone]).  She reports patient has chronic iron deficiency anemia, requiring intermittent IV iron infusions, refuses or doesn't tolerate po iron.  Has refused endoscopy/colonoscopy in workup.  Also has recently increasing WBC (since October 2015), ranging 15-20K.    - Left message with patient's hematologist, Dr. Lazarus Salines ([place]).    3. COPD, O2 dependent- mgmt per hospitalist.  4. Pulmonary hypertension- mgmt per hospitalist.  5. Aortic sclerosis - mgmt per hospitalist.   6. History of cervical cancer, s/p chemo/EBRT about 10 years ago - accordign           Discussed with patient and RN at bedside.    HPI:     Joanne Ruiz is a 74 y.o. female with multiple medical problems, who awoke yesterday am with deep, low LLQ pain which worsened with time and became more diffuse.  She did have one episode of diarrhea.  She presented to [facility] ED, where her WBC was 33 and a CT scan showed tiny amounts of free air and possible/subtle diverticulitis.  Due to comorbidities she was transferred here for further care. She has received ertapenem and IV hydration.  She reports feeling better today than yesterday, though still with pain.  No nausea or vomiting, no prodrome, no fever/chills/sweats.  She has never had similar symptoms.  She does acknowledge a colonoscopy years ago, unsure whether she had polyps or diverticula noted.  She has never had abdominal surgery.  She did have pelvic XRT with chemo for cervical cancer about 10 years ago, no recent follow up.    Past Medical History   Diagnosis Date   ??? Chronic obstructive pulmonary disease ([place])    ??? Hypertension    ??? Anemia    ??? Thrombocytosis ([place])    ??? Cervical cancer ([place])    ??? Spinal stenosis    ??? Hydronephrosis    ??? Osteoporosis    ???  Detached retina    ??? Goiter    ??? Pulmonary HTN ([place])    ??? Nocturnal hypoxia    ??? Aortic sclerosis [facility])        Past Surgical History   Procedure Laterality Date   ??? Hx open reduction internal fixation Left Elbow   ??? Hx other surgical  Eye Surgery       History reviewed. No pertinent family history.    History     Social History   ??? Marital Status: SINGLE     Spouse Name: N/A   ??? Number of Children: N/A   ??? Years of Education: N/A     Social History Main Topics   ??? Smoking status: Current Every Day Smoker -- 0.25 packs/day   ??? Smokeless tobacco: Not on file   ??? Alcohol Use: No   ??? Drug Use: Not on file   ??? Sexual Activity: No     Other Topics Concern   ??? None     Social History Narrative   ??? None        Prior to Admission medications    Medication Sig Start Date End Date Taking? Authorizing Provider   hydroxyurea (HYDREA) 500 mg capsule Take 500 mg by mouth daily.   Yes Phys Other, MD   furosemide (LASIX) 40 mg tablet Take 80 mg by mouth daily.   Yes Phys Other, MD       Allergies   Allergen Reactions   ??? Beta-Blockers (Beta-Adrenergic Blocking Agts) Seizures   ??? Sulfa (Sulfonamide Antibiotics) Hives       Review of Systems  A comprehensive review of systems was negative except for that written in the History of Present Illness.      Physical Exam:      Visit Vitals   Item Reading   ??? BP 124/69 mmHg   ??? Pulse 92   ??? Temp 97.7 ??F (36.5 ??C)   ??? Resp 17   ??? Ht _0  (1.575 m)   ??? Wt 74.844 kg (165 lb)   ??? BMI 30.17 kg/m2   ??? SpO2 94%   ??? Breastfeeding No       Physical Exam:  Physical Exam:   General:  Alert, cooperative, no distress, appears stated age.   Eyes:  Conjunctivae/corneas clear.    Mouth/Throat: Moist mucosa   Neck: Supple, symmetrical, trachea midline.   Lungs:   Clear to auscultation bilaterally.  Decreased at bases.   Heart:  Regular rate and rhythm.  No JVD   Abdomen:   Soft, non-distended. Bowel sounds decreased but present. Moderate lower abdominal and mild upper abdominal tenderness without guarding or rebound. No masses,  No hernia   Extremities: Extremities normal, atraumatic, no cyanosis or edema.   Pulses: 2+ and symmetric all extremities.   Skin: Skin color, texture, turgor normal. No rashes or lesions   Neurologic: Grossly normal       Labs Reviewed:  Recent Labs      02/03/15   0826  02/03/15   0041  02/02/15   2337   WBC   --    --   43.0*   HCT  28.9*  28.6*  30.3*     Recent Labs      02/02/15   2337   BUN  16   CREA  1.2   K  4.5   NA  141   CL  108*   CO2  25   GLU  134*  Recent Labs      02/02/15   2337   ALB  3.4     Recent Results (from the past 12 hour(s))   LACTIC ACID, PLASMA    Collection Time: 02/02/15 10:56 PM   Result Value Ref Range    Lactic Acid 1.1 0.4 - 2.0 mmol/L    METABOLIC PANEL, COMPREHENSIVE    Collection Time: 02/02/15 11:37 PM   Result Value Ref Range    Sodium 141 136 - 145 mEq/L    Potassium 4.5 3.5 - 5.1 mEq/L    Chloride 108 (H) 98 - 107 mEq/L    CO2 25 21 - 32 mEq/L    Glucose 134 (H) 74 - 106 mg/dl    BUN 16 7 - 25 mg/dl    Creatinine 1.2 0.6 - 1.3 mg/dl    GFR est AA 56.0      GFR est non-AA 47      Calcium 7.9 (L) 8.5 - 10.1 mg/dl    AST 16 15 - 37 U/L    ALT 15 12 - 78 U/L    Alk. phosphatase 79 45 - 117 U/L    Bilirubin, total 0.8 0.2 - 1.0 mg/dl    Protein, total 6.3 (L) 6.4 - 8.2 gm/dl    Albumin 3.4 3.4 - 5.0 gm/dl   CBC W/O DIFF    Collection Time: 02/02/15 11:37 PM   Result Value Ref Range    WBC 43.0 (H) 4.0 - 11.0 1000/mm3    RBC 2.52 (L) 3.60 - 5.20 M/uL    HGB 8.8 (L) 13.0 - 17.2 gm/dl    HCT 30.3 (L) 37.0 - 50.0 %    MCV 120.2 (H) 80.0 - 98.0 fL    MCH 34.9 (H) 25.4 - 34.6 pg    MCHC 29.0 (L) 30.0 - 36.0 gm/dl    PLATELET 435 140 - 450 1000/mm3    MPV 11.8 (H) 6.0 - 10.0 fL    RDW-SD 80.8 (HH) 36.4 - 46.3     GLUCOSE, POC    Collection Time: 02/03/15 12:30 AM   Result Value Ref Range    Glucose (POC) 141 (H) 65 - 105 mg/dL   TYPE, ABO & RH    Collection Time: 02/03/15 12:41 AM   Result Value Ref Range    ABO/Rh(D) AB Rh Positive     ANTIBODY SCREEN    Collection Time: 02/03/15 12:41 AM   Result Value Ref Range    Antibody screen NEG     LACTIC ACID, PLASMA    Collection Time: 02/03/15 12:41 AM   Result Value Ref Range    Lactic Acid 0.8 0.4 - 2.0 mmol/L   HGB & HCT    Collection Time: 02/03/15 12:41 AM   Result Value Ref Range    HGB 8.3 (L) 13.0 - 17.2 gm/dl    HCT 28.6 (L) 37.0 - 50.0 %   HGB & HCT    Collection Time: 02/03/15  8:26 AM   Result Value Ref Range    HGB 8.3 (L) 13.0 - 17.2 gm/dl    HCT 28.9 (L) 37.0 - 50.0 %       Images Reviewed:  CT images reviewed on disc- see HPI          Christell Faith, MD  February 03, 8840  8:45 AM

## 2015-02-03 NOTE — Other (Signed)
TRANSFER - IN REPORT:    Verbal report received from Electronic Data SystemsCameron Stone RN*(name) on Joanne PartyLinda Ruiz  being received from ED(unit) for routine progression of care      Report consisted of patient???s Situation, Background, Assessment and   Recommendations(SBAR).     Information from the following report(s) SBAR, Kardex, ED Summary, Procedure Summary, Intake/Output, MAR, Recent Results, Med Rec Status and Cardiac Rhythm NSR was reviewed with the receiving nurse.    Opportunity for questions and clarification was provided.      Assessment completed upon patient???s arrival to unit and care assumed.

## 2015-02-03 NOTE — Progress Notes (Signed)
Case Management Assessment  Plan is home with HH.  Family undecided regarding HH needs or agency. Lives in [place] .  [facility] covers this area.          Preferred Language for Healthcare Related Communication     Preferred Language for Healthcare Related Communication: English   Spiritual/Ethnic/Cultural/Religious Needs that Should be Incorporated Into Your Care   Spiritual/Ethnic/Cultural/Religious Needs that Should be Incorporated Into Your Care:  (none)      FUNCTIONAL ASSESSMENT   Fall in Past 12 Months: No               Decline in Gait/Transfer/Balance: No       Decline in Capacity to Feed/Dress/Bathe: No   Developmental Delay: No   Chewing/Swallowing Problems: No      DYSPHAGIA SCREENING                          Difficulty with Secretions     Difficulty with Secretions: No      Speech Slurred/Thick/Garbled     Speech Slurred/Thick/Garbled: No      ABUSE/NEGLECT SCREENING   Physical Abuse/Neglect: Denies   Sexual Abuse: Denies   Sexual Abuse: Denies   Other Abuse/Issues: Denies          PRIMARY DECISION MAKER   Primary Decision Maker Name: Joanne Ruiz     Primary Decision Maker Phone Number: [phone]   Primary Decision Maker Address: [place].   Primary Decision Maker Relationship to Patient: Adult child                      ADVANCE CARE PLANNING (ACP) DOCUMENTS   Confirm Advance Directive: None       Does the patient have other document types:  (none)      Suicide/Psychosocial Screening   Primary Diagnosis or Primary Complaint of an Emotional Behavior Disorder: No   Patient is Currently Experiencing Depression: No   Suicidal Ideation/Attempts: No   Homicidal Ideation/Attempts: No   Alcohol/Drug Intoxication: No   Hallucinations/Delusions: No   Pending, Active, or Temporary Detention Orders: No   Aggressive/Inappropriate Behavior: No      SAD PERSONS                                                  READMIT RISK TOOL   Support Systems: Family member(s), Friends \\ neighbors    Relationship with Primary Physician Group: Seen at least one time within the past 6 months   History of Falls Within Past 3 Months: Yes   Needs Assistance with Wound Care AND/OR Mgnt of O2, Nebulizer: No   Requires Financial, Physical and/or Educational Assistance With Medications: No   History of Mental Illness: No   Living Alone: Yes      CARE MANAGEMENT INTERVENTIONS       PCP Verified by CM: Yes           Mode of Transport at Discharge: Other (see comment) (family)                                       Current Support Network: Lives Alone   Reason for Referral: DCP Rounds   History Provided By: Child/Family  Patient Orientation: Other (see comment) (asleep)           Previous Living Arrangement: Lives Alone Independent       Prior Functional Level: Independent in ADLs/IADLs       Primary Language: English   Can patient return to prior living arrangement: Yes       Family able to assist with home care needs:: No                   Anticipated Discharge Needs: Home Health Services           Plan discussed with Pt/Family/Caregiver: Yes          DISCHARGE LOCATION   Discharge Placement: Home with home health

## 2015-02-03 NOTE — Progress Notes (Signed)
Hospitalist Progress Note    Patient: Joanne Ruiz               Sex: female          DOA: 02/02/2015       Date of Birth:  Nov 01, 1940      Age:  74 y.o.        LOS:  LOS: 1 day     PCP: PROVIDER UNKNOWN   Assessment and Plan:   1.?? Colonic perforation.  -Appreciate Surgery evaluation.  -conservative Mx for now.  -Continue Empiric IV Abx.  -bowel rest with NPO.  -IV hydration.    2.??Suspected  Sigmoid diverticulitis.  -Continue Abx.  -monitor Fever, WBC trend  -supportive care.    3.Pancreatic tail cystic mass  -outpt follow up.    4.Anemia of chronic disease  -suspect Iron deficiency anemia with outpt Iv Iron therapy per records  -monitor Iron studies, H/H stable.    5.?? Pulmonary hypertension  -wait Echocardiogram  supplemental oxygen on Home Oxygen.    6.COPD  -Oxygen  -Neb PRN    7.Aortic stenosis  -wait echo.   transfer to medical floor.    Problem List:  Active Problems:    Perforated viscus (02/02/2015)                Subjective: abdominal pain better, no chest pain/ shortness of breath.     Joanne Ruiz is a 74 y.o. year old female who is being seen for abdominal pain.    Objective: not in distress.      Vital Signs:  Visit Vitals   Item Reading   ??? BP 124/69 mmHg   ??? Pulse 92   ??? Temp 97.7 ??F (36.5 ??C)   ??? Resp 17   ??? Ht '5\' 2"'  (1.575 m)   ??? Wt 74.844 kg (165 lb)   ??? BMI 30.17 kg/m2   ??? SpO2 94%   ??? Breastfeeding No       Physical Exam:  General appearance:Obese, pleasant, alert, cooperative, no distress  Head: Normocephalic, , atraumatic  Neck: supple, trachea midline  Lungs: clear to auscultation bilaterally  Heart: regular rate and rhythm, S1, S2 normal, no murmur.  Abdomen: soft, mild -tender. Bowel sounds hypoactive.   Extremities: no cyanosis or edema  Skin: Skin color, texture, turgor normal. No rashes or lesions  Neurologic:Extremity strength bilaterally 5/5    Intake and Output:  Last three shifts:  04/17 1901 - 04/19 0700  In: 530 [I.V.:530]  Out: 450 [Urine:450]    Lab Results:   Recent Results (from the past 24 hour(s))   LACTIC ACID, PLASMA    Collection Time: 02/02/15 10:56 PM   Result Value Ref Range    Lactic Acid 1.1 0.4 - 2.0 mmol/L   METABOLIC PANEL, COMPREHENSIVE    Collection Time: 02/02/15 11:37 PM   Result Value Ref Range    Sodium 141 136 - 145 mEq/L    Potassium 4.5 3.5 - 5.1 mEq/L    Chloride 108 (H) 98 - 107 mEq/L    CO2 25 21 - 32 mEq/L    Glucose 134 (H) 74 - 106 mg/dl    BUN 16 7 - 25 mg/dl    Creatinine 1.2 0.6 - 1.3 mg/dl    GFR est AA 56.0      GFR est non-AA 47      Calcium 7.9 (L) 8.5 - 10.1 mg/dl    AST 16 15 - 37 U/L  ALT 15 12 - 78 U/L    Alk. phosphatase 79 45 - 117 U/L    Bilirubin, total 0.8 0.2 - 1.0 mg/dl    Protein, total 6.3 (L) 6.4 - 8.2 gm/dl    Albumin 3.4 3.4 - 5.0 gm/dl   CBC W/O DIFF    Collection Time: 02/02/15 11:37 PM   Result Value Ref Range    WBC 43.0 (H) 4.0 - 11.0 1000/mm3    RBC 2.52 (L) 3.60 - 5.20 M/uL    HGB 8.8 (L) 13.0 - 17.2 gm/dl    HCT 30.3 (L) 37.0 - 50.0 %    MCV 120.2 (H) 80.0 - 98.0 fL    MCH 34.9 (H) 25.4 - 34.6 pg    MCHC 29.0 (L) 30.0 - 36.0 gm/dl    PLATELET 435 140 - 450 1000/mm3    MPV 11.8 (H) 6.0 - 10.0 fL    RDW-SD 80.8 (HH) 36.4 - 46.3     GLUCOSE, POC    Collection Time: 02/03/15 12:30 AM   Result Value Ref Range    Glucose (POC) 141 (H) 65 - 105 mg/dL   TYPE, ABO & RH    Collection Time: 02/03/15 12:41 AM   Result Value Ref Range    ABO/Rh(D) AB Rh Positive     ANTIBODY SCREEN    Collection Time: 02/03/15 12:41 AM   Result Value Ref Range    Antibody screen NEG     LACTIC ACID, PLASMA    Collection Time: 02/03/15 12:41 AM   Result Value Ref Range    Lactic Acid 0.8 0.4 - 2.0 mmol/L   HGB & HCT    Collection Time: 02/03/15 12:41 AM   Result Value Ref Range    HGB 8.3 (L) 13.0 - 17.2 gm/dl    HCT 28.6 (L) 37.0 - 50.0 %   HGB & HCT    Collection Time: 02/03/15  8:26 AM   Result Value Ref Range    HGB 8.3 (L) 13.0 - 17.2 gm/dl    HCT 28.9 (L) 37.0 - 50.0 %        Medications:  Current Facility-Administered Medications    Medication Dose Route Frequency   ??? morphine injection 4 mg  4 mg IntraVENous Q4H PRN   ??? ondansetron (ZOFRAN) injection 4 mg  4 mg IntraVENous Q4H PRN   ??? 0.9% sodium chloride infusion  100 mL/hr IntraVENous CONTINUOUS   ??? ertapenem (INVANZ) 1 g in 0.9% sodium chloride (MBP/ADV) 50 mL MBP  1 g IntraVENous Q24H   ??? albuterol CONCENTRATE 2.1m/0.5 mL neb soln  2.5 mg Nebulization Q4H PRN   ??? ipratropium (ATROVENT) 0.02 % nebulizer solution 0.5 mg  0.5 mg Nebulization Q6H PRN   ??? HYDROmorphone (PF) (DILAUDID) injection 1 mg  1 mg IntraVENous Q3H PRN   ??? pantoprazole (PROTONIX) 40 mg in sodium chloride 0.9 % 10 mL injection  40 mg IntraVENous Q12H   ??? HYDROmorphone (PF) (DILAUDID) injection 1 mg  1 mg IntraVENous Q3H PRN         KEdger House MD  February 03, 2015  11:40 AM

## 2015-02-03 NOTE — ED Provider Notes (Signed)
[facility]  EMERGENCY DEPARTMENT TREATMENT REPORT  NAME:  Joanne Ruiz  SEX:   F  ADMIT: 02/02/2015  DOB:   03/25/1941  MR#    [id number]  ROOM:  01  TIME DICTATED: 11 27 PM  ACCT#  [id number]        TIME:  2321    CHIEF COMPLAINT:  Abdominal pain.    HISTORY OF PRESENT ILLNESS:  This is a 74 year old female with a past medical history remarkable for   pulmonary hypertension, COPD, essential thrombocytosis and systemic   hypertension, who presents today with a chief complaint of abdominal pain.    She was diagnosed with a perforated what looked like sigmoid diverticulum at   the [facility].  She said that she woke up this morning and then had   abrupt onset of abdominal pain this morning at around 0800.  She initially   tried to wait it out, but it did not get any better, so she went to the [facility].  They did a CBC there that was notable for leukocytosis at 33.    Her hemoglobin was 10 and platelets were 48.  She had 7% bands.  Her lactic   acid was 1.  The rest of her labs were grossly unremarkable.  They did a CT   scan there, which showed pneumoperitoneum as well as colonic diverticulosis   and what looked like sigmoid diverticulitis, and so they suspected that was   the likely source.  They also saw a little bit of splenomegaly and a few other   incidental findings as well.  She was given ertapenem there and subsequently   transferred here for further evaluation, and Dr. Gabriel Rainwater, who is the Colorectal   Surgeon, was notified of the patient's transfer and was expecting the   patient.  The patient reports the pain is moderate right now, worse if she is   touched at all.    REVIEW OF SYSTEMS:  CONSTITUTIONAL:  No fevers, no chills.  EYES:   No visual symptoms.   ENT:  No sore throat, runny nose, or other URI symptoms.   ENDOCRINE:  No diabetic symptoms.   HEMATOLOGIC/LYMPHATIC:   No excessive bruising or lymph node swelling.    ALLERGIC/IMMUNOLOGIC:  No urticaria or allergy symptoms.  RESPIRATORY:  No cough, maybe some slight shortness of breath right now, which   she says is about baseline for her.  She is not wheezing and has not had any   wheezing recently.  CARDIOVASCULAR:  No chest pain, chest pressure, or palpitations.   GASTROINTESTINAL:  As noted in HPI.  GENITOURINARY:  No dysuria, frequency, or urgency.  MUSCULOSKELETAL:  No joint pain or swelling.   INTEGUMENTARY:  No rashes.     PAST MEDICAL HISTORY:  Pulmonary hypertension, COPD and essential thrombocytosis.    PAST SURGICAL HISTORY:  She has had an ORIF of her left elbow.    SOCIAL HISTORY:  She is a current everyday smoker; denies illicits or ethanol.    FAMILY HISTORY:  Negative.    PHYSICAL EXAMINATION:  VITAL SIGNS:  On arrival here, the patient had the following vital signs:    Pulse 97, blood pressure 110/57, respiratory rate 24, oxygen saturation 94% on   2 L by nasal cannula, temperature is 99.3 degrees Fahrenheit orally.    CONSTITUTIONAL:  In general, this is a well-developed, well-nourished female.    She is in no acute distress.  HEENT:  She has no JVD.  She has moist mucous membranes.  EYES:  No scleral icterus at all.  CARDIOVASCULAR:  Heart is regular rate and rhythm.  No murmurs, rubs, gallops.  RESPIRATORY:  Lungs are clear to auscultation bilaterally.  I do not hear any   wheezing right now.  She is not in any respiratory distress at all.  ABDOMEN:  Abdomen is tender in all 4 quadrants.  There is no guarding.  There   is a slight bit of rebound worse in the left lower quadrant than the other   quadrants right now, but she is able to laugh and cough and that did not seem   to bother her too much right now.  SKIN:  Warm, dry, well perfused.  MUSCULOSKELETAL:  No clubbing, cyanosis or edema.  NEUROLOGIC:  She is alert and oriented to person, place and time.    INITIAL ASSESSMENT AND MANAGEMENT PLAN:   A 74 year old female with a recently diagnosed perforated viscus at an outside   hospital.  Lactate of 1 noted at the other hospitals.  We will recheck right   now.  Will also repeat CBC, BMP and discussed the case with Dr. Gabriel RainwaterJaklic, the   consulting surgeon on the case.    EMERGENCY DEPARTMENT COURSE:  IV fluids were started at a rate of 125 an hour.  I discussed the case with   Dr. Gabriel RainwaterJaklic and also discussed the case with the hospitalist.    MEDICAL DECISION MAKING:  A 74 year old female with a past medical history of hypertension, essential   tremor cytosis, pulmonary hypertension, and COPD who presents today with chief   complaint of abdominal pain and a recently diagnosed perforated viscus at the   outside hospital.  After a long discussion with Dr. Gabriel RainwaterJaklic, she felt that the   patient at this point was stable and given her multiple comorbidities, she   thought it was appropriate that Hematology see her in the morning, to optimize   her as best as possible for surgery.  I discussed the case with Dr.   Earlie LouJasarevic, the hospitalist, who agreed to admit the patient to the PCCU, who   did ask that we repeat a CBC and BMP in addition to the lactate that I had   already ordered.  She received ertapenem earlier today, which I believe is   appropriate coverage for her.  She should be due for this in another couple of   hours as well for another dose of this.    FINAL DIAGNOSES:  1.  Perforated viscus.  2.  Abdominal pain.  3.  Thrombocytosis.  4.  Hypertension.  5.  Chronic obstructive pulmonary disease.    DISPOSITION:  PCCU.    CONDITION:  Stable.      The patient was personally evaluated by myself and Dr. Luciano CutterHahn, who agrees with   the above assessment and plan.   I hereby certify this patient for admission   based upon medical necessity as noted below.       CONTINUATION BY Geena Weinhold Consuella LoseA. Rose Hegner, MD:    COURSE IN THE EMERGENCY DEPARTMENT:    The patient was seen by me.  Briefly, a 74 year old female who presents to the    ER today.  [facility].    She was seen and evaluated by Dr. Wilma FlavinBloom tonight.  He discussed the patient   with Dr. Gabriel RainwaterJaklic as well as hospitalist.  She is admitted  to their service.    She had previously been medicated with ertapenem.  She started having   abdominal pain this morning.  She denies chest pain.  She does have some   shortness of breath, currently on oxygen.  Does not wear oxygen at home.  Has   a history of COPD.  Currently her lungs are clear.  Her labs and CT were   reviewed from the [employer] [facility].  Her white count was elevated at 33,000.  She   had a left shift with 85% segs and 7% bands.  Her CT was reviewed.  It showed   pneumoperitoneum as well as diverticulitis and suspected perforated   diverticula.  The patient is admitted.    ADMISSION DIAGNOSIS:  Per Dr. Wilma Flavin.      Please see his note for full details.        ___________________  Candace Cruise MD  Dictated By: Corinna Gab, MD    My signature above authenticates this document and my orders, the final  diagnosis (es), discharge prescription (s), and instructions in the [facility]   Pul[facility]ck record.  If you have any questions please contact [phone].    Nursing notes have been reviewed by the physician/ advanced practice   clinician.    RKV  D:02/02/2015 23:27:29  T: 02/03/2015 10:05:05  [id number]

## 2015-02-03 NOTE — Other (Signed)
Bedside and Verbal shift change report given to Dorothey BasemanJarrod Raider Valbuena (oncoming nurse) by Lupita LeashJamie Hotalan (offgoing nurse). Report included the following information Kardex, Intake/Output, MAR and Recent Results.

## 2015-02-03 NOTE — Progress Notes (Signed)
Pt seen again.  Feels about the same, pain no worse.  Remains afebrile, normal VS.  Exam less tender.  Will continue to follow.  Discussed with patient and family at bedside.    Samuel JesterBETH Joanann Mies, MD.  February 03, 2015   4:25 PM

## 2015-02-03 NOTE — H&P (Signed)
[facility]  History and Physical  NAME:  Joanne Ruiz, Joanne Ruiz  SEX:   F  ADMIT: 02/02/2015  DOB:07/01/1941  MR#    [id number]  ROOM:  01  ACCT#  [id number]    I hereby certify this patient for admission based upon medical necessity as   noted below:    <    DATE OF ADMISSION:  02/02/2015    CHIEF COMPLAINT:  Abdominal pain.    HISTORY OF PRESENT ILLNESS:  This is a 74 year old female with past medical history significant for anemia,   thrombocytopenia, acid stomach, hypertension, pulmonary hypertension, history   of cervical cancer, spinal stenosis, hydronephrosis, osteoporosis, history of   detached retina, goiter, COPD oxygen dependent (2 liters at nighttime) and   aortic sclerosis, who presents to the emergency room with a 1-day duration of   abdominal pain.  The patient woke up with the pain in a.m., presented   initially to [facility] where a CT scan of the abdomen and pelvis   was obtained, showing a pneumoperitoneum consistent with perforated viscus.    There was also colonic diverticulosis and minimal findings suggesting sigmoid   diverticulitis.  A perforated sigmoid diverticulum is a possibility.  No other   source of the pneumoperitoneum identified per radiologist read.  The patient   subsequently had a surgical consultation there, and decision was made to   transfer to [facility].  Dr. Gabriel Rainwater from Colorectal   Surgery was consulted,   recommends medical management at this time given   normal lactic acid and initial patient's abdominal exam.  The patient also   complained of 1 episode of diarrhea.  No fevers, no nausea or vomiting.  No   chest pain, no shortness of breath.    REVIEW OF SYSTEMS:  A 12-point review of systems is negative except as in HPI.    PAST MEDICAL HISTORY:  As per HPI.    PAST SURGICAL HISTORY:  Eye surgery and open reduction, internal fixation of the elbow.    SOCIAL HISTORY:   The patient smokes on a daily basis, about 0.25 packs; has been smoking for 60   years.  Occasional alcohol use.    ALLERGIES:  BETA BLOCKERS RESULT IN ANAPHYLAXIS AND SEIZURES.  SULFA MEDICATIONS RESULT IN   A RASH.    HOME MEDICATIONS:  Include furosemide 80 mg daily and hydroxyurea 500 mg daily.    PHYSICAL EXAMINATION:  VITAL SIGNS:  Blood pressure 110/57, pulse 97, respiratory rate 24,   temperature is 99.3, saturating 94% on 2 liters of oxygen via nasal cannula.  GENERAL APPEARANCE:  No acute distress, resting comfortably in bed and   complaining of abdominal pain.  HEENT:  Normocephalic, atraumatic.  Extraocular muscles intact.  Pupils equal,   round, and reactive to light and accommodation.  Slightly dry mucous   membranes.  NECK:  Supple.  No JVD, no lymphadenopathy.  LUNGS:  Minimal crackles at the bases, otherwise good air entry.  No wheezing.  CARDIAC:  Borderline tachycardic.  No murmurs, rubs or gallops.  ABDOMEN:  Positive bowel sounds.  Soft, nondistended.  Moderate tenderness to   mild palpation of the left lower quadrant.  Minimal voluntary guarding.  EXTREMITIES:  Trace pretibial edema bilaterally.  SKIN:  No rashes, no wounds.  NEUROLOGIC:  Cranial nerves II-XII grossly intact.  Nonfocal.  Alert and   oriented times 3.    LABORATORY RESULTS:  CMP within normal range.  White blood cell  count 43,000, platelets 135,   hemoglobin and hematocrit 8.8 and 30.3.  Lactic acid 1.1.    IMAGING STUDIES:  CT of the abdomen and pelvis as per HPI.    ASSESSMENT AND PLAN:  1.  Colonic perforation:  Colorectal Surgery consulted.  IV fluids.  Trend   lactate.  Serial abdominal exams.  Ertapenem will be continued.  2.  Sigmoid diverticulitis:  Continue ertapenem, IV fluids, pain management.  3.  Cystic mass of pancreatic tail, likely benign.  Similar appearance   compared to prior study years ago.  Outpatient followup and interval imaging.  4.  Anemia:  Fecal occult blood testing [facility],   and the patient was positive; however, no obvious blood loss, no bright red   blood or dark stools per rectum.  Will trend hemoglobin and hematocrit every 8   hours.  Iron studies.  Type and screen.  Protonix.  5.  Pulmonary hypertension:  Will obtain 2D echocardiogram to evaluate the   severity.  Patient uses oxygen at night at home.  Pulmonary Critical Care   consultation in the a.m.  6.  Systemic hypertension, controlled.  Add PRNs if needed.  The patient is   ALLERGIC TO BETA BLOCKERS.  7.  Chronic obstructive pulmonary disease on home oxygen:  Continue albuterol,   ipratropium nebulizers.  Supplemental oxygen as needed.  No wheezing on lung   exam.  8.  Diet is n.p.o.  9.  Deep venous thrombosis prophylaxis with sequential compression devices.    DISPOSITION:  We will place in the step down unit for further evaluation with Colorectal   Surgery consulted.  Pending their evaluation, likely for surgical   intervention.    CODE STATUS:  FULL CODE.    Case discussed with ER attending and the patient.      ___________________  Irving BurtonMuhamed Shakea Isip MD  Dictated By: .   MC  D:02/03/2015 00:14:40  T: 02/03/2015 00:54:56  [id number]

## 2015-02-04 LAB — CBC WITH AUTOMATED DIFF
BAND NEUTROPHILS: 2.9 % (ref 0–11)
BASOPHILS: 1.9 % (ref 0–3)
EOSINOPHILS: 1 % (ref 0–5)
HCT: 29.1 % — ABNORMAL LOW (ref 37.0–50.0)
HGB: 8 gm/dl — ABNORMAL LOW (ref 13.0–17.2)
LYMPHOCYTES: 1 % — ABNORMAL LOW (ref 28–48)
MCH: 33.9 pg (ref 25.4–34.6)
MCHC: 27.5 gm/dl — ABNORMAL LOW (ref 30.0–36.0)
MCV: 123.3 fL — ABNORMAL HIGH (ref 80.0–98.0)
METAMYELOCYTES: 1 % — ABNORMAL HIGH (ref 0–0)
MONOCYTES: 1 % (ref 1–13)
MPV: 11.6 fL — ABNORMAL HIGH (ref 6.0–10.0)
MYELOCYTES: 3.8 % — ABNORMAL HIGH (ref 0–0)
NEUTROPHILS: 85.6 % — ABNORMAL HIGH (ref 34–64)
NRBC: 1 — ABNORMAL HIGH (ref 0–0)
PLATELET COMMENTS: NORMAL
PLATELET: 429 10*3/uL (ref 140–450)
PROMYELOCYTES: 1 % — ABNORMAL HIGH (ref 0–0)
RBC: 2.36 M/uL — ABNORMAL LOW (ref 3.60–5.20)
RDW-SD: 84.7 — CR (ref 36.4–46.3)
UNIDENTIFIED MONONUCLEAR CELL: 1 % — ABNORMAL HIGH (ref 0–0)
WBC: 32 10*3/uL — ABNORMAL HIGH (ref 4.0–11.0)

## 2015-02-04 LAB — METABOLIC PANEL, BASIC
BUN: 15 mg/dl (ref 7–25)
CO2: 24 mEq/L (ref 21–32)
Calcium: 8.1 mg/dl — ABNORMAL LOW (ref 8.5–10.1)
Chloride: 111 mEq/L — ABNORMAL HIGH (ref 98–107)
Creatinine: 0.9 mg/dl (ref 0.6–1.3)
GFR est AA: >60
GFR est non-AA: >60
Glucose: 110 mg/dl — ABNORMAL HIGH (ref 74–106)
Potassium: 4.5 mEq/L (ref 3.5–5.1)
Sodium: 144 mEq/L (ref 136–145)

## 2015-02-04 LAB — HGB & HCT
HCT: 27 % — ABNORMAL LOW (ref 37.0–50.0)
HCT: 26.7 % — ABNORMAL LOW (ref 37.0–50.0)
HGB: 7.6 gm/dl — ABNORMAL LOW (ref 13.0–17.2)
HGB: 7.5 gm/dl — ABNORMAL LOW (ref 13.0–17.2)

## 2015-02-04 MED ORDER — ACETAMINOPHEN 325 MG TABLET
325 mg | Freq: Four times a day (QID) | ORAL | Status: DC | PRN
Start: 2015-02-04 — End: 2015-02-10
  Administered 2015-02-04 – 2015-02-07 (×6): via ORAL

## 2015-02-04 MED ORDER — IRON SUCROSE 100 MG/5 ML IV SOLN
100 mg iron/5 mL | Freq: Once | INTRAVENOUS | Status: AC
Start: 2015-02-04 — End: 2015-02-04
  Administered 2015-02-04: 15:00:00 via INTRAVENOUS

## 2015-02-04 MED FILL — PROTONIX 40 MG INTRAVENOUS SOLUTION: 40 mg | INTRAVENOUS | Qty: 40

## 2015-02-04 MED FILL — ACETAMINOPHEN 325 MG TABLET: 325 mg | ORAL | Qty: 2

## 2015-02-04 MED FILL — MORPHINE 4 MG/ML SYRINGE: 4 mg/mL | INTRAMUSCULAR | Qty: 1

## 2015-02-04 MED FILL — VENOFER 100 MG IRON/5 ML INTRAVENOUS SOLUTION: 100 mg iron/5 mL | INTRAVENOUS | Qty: 15

## 2015-02-04 MED FILL — INVANZ 1 GRAM SOLUTION FOR INJECTION: 1 gram | INTRAMUSCULAR | Qty: 1

## 2015-02-04 MED FILL — HYDROMORPHONE (PF) 1 MG/ML IJ SOLN: 1 mg/mL | INTRAMUSCULAR | Qty: 1

## 2015-02-04 NOTE — Other (Signed)
Bedside and Verbal shift change report given to CHASSIDY A KEAR, RN (oncoming nurse) by Joy, RN (offgoing nurse). Report included the following information SBAR, Kardex and MAR.

## 2015-02-04 NOTE — Progress Notes (Addendum)
spokew with son today in room and they are concerned for her with transportation back home. Patient has 2 sons and they are discussing the options for treatment and possible placement if needed even for short term care of her.  Will up date the DC plan as needed for any changes in the DC plan         4.22.16 at 2:50PM  Loaded patient into E DC for SNF in [place] as back up plan for DC, SW consult for PASR done and awaiting final dispo and possible placement.  PT recommend SNF for rehab.

## 2015-02-04 NOTE — Other (Signed)
TRANSFER - IN REPORT:    Verbal report received from Jarrod RN(name) on [name]  being received from PCCU(unit) for routine progression of care      Report consisted of patient???s Situation, Background, Assessment and   Recommendations(SBAR).     Information from the following report(s) SBAR, Kardex, [facility] and Recent Results was reviewed with the receiving nurse.    Opportunity for questions and clarification was provided.      Assessment completed upon patient???s arrival to unit and care assumed.

## 2015-02-04 NOTE — Other (Signed)
Bedside and Verbal shift change report given to Lori (oncoming nurse) by Chassidy (offgoing nurse). Report included the following information SBAR, Kardex, MAR and Recent Results.

## 2015-02-04 NOTE — Progress Notes (Signed)
[facility]  Colon and Rectal Surgery  [phone]    CONSULT NOTE    Today's date: February 04, 2015  Patient: Joanne Ruiz                 Sex: female        Date of Birth:  [birth year]      Age:  74 y.o.          DOA: 02/02/2015  LOS:  LOS: 2 days         Assessment/Plan     1.?? Perforated viscus, likely but not definitively diverticulitis.?? Very very scant pneumoperitoneum on CT scan (several tiny bubbles.)?? Hemodynamically stable.    - Pain continuing to improve with IV antibiotics and bowel rest.?? Exam improving.  Remains afebrile, hemodynamically stable.  Leukocytosis improving.    - No plan for operative intervention- this is very likely to resolve non-operatively.??   - Continue IV abx (ertapenem appropriate), bowel rest, and judicious pain control until pain mild.  Can then advance diet very gradually, and as long as she does well with that can transition to po abx for continued outpatient course.  - Should eventually have colonoscopy as outpatient (~6 weeks)    2.?? 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 With ET there is a risk of leukemoid transformation as well as development of acquired Von Willebrand's- need to find out her current status- crucial information should surgery eventually become necessary.?? May need hematology consult here.  - Spoke with patient's PCP, Dr. Shari HeritageJamie Fountain [phone]).?? She reports patient has chronic iron deficiency anemia, requiring intermittent IV iron infusions, refuses or doesn't tolerate po iron.?? Has refused endoscopy/colonoscopy in workup.?? Also has recently increasing WBC (since October 2015), ranging 15-20K.?? ??  - Left message with patient's hematologist, [facility]    3. COPD, O2 dependent- mgmt per hospitalist.  4. Pulmonary hypertension- mgmt per hospitalist.  5. Aortic sclerosis - mgmt per hospitalist.   6. History of cervical cancer, s/p chemo/EBRT about 10 years ago - according to Dr. Leota JacobsenFountain had some chest lesion she deferred further w/u for.      Subjective:   Feels ok, about the same.  Had a small BM, passing flatus.  No nausea, a little hungry.  Feeling a little weak.  Vitals:   Patient Vitals for the past 24 hrs:   Temp Pulse Resp BP SpO2   02/04/15 0755 100.4 ??F (38 ??C) 91 19 110/50 mmHg 97 %   02/04/15 0346 - 98 - - -   02/04/15 0325 98.3 ??F (36.8 ??C) (!) 102 20 121/59 mmHg -   02/04/15 0202 - (!) 101 20 111/49 mmHg (!) 89 %   02/04/15 0002 99.5 ??F (37.5 ??C) 97 23 122/58 mmHg 92 %   02/03/15 2202 - 94 22 107/43 mmHg 90 %   02/03/15 2002 98.8 ??F (37.1 ??C) 87 25 112/52 mmHg 95 %   02/03/15 1902 - 90 20 98/50 mmHg 95 %   02/03/15 1802 - 92 18 (!) 123/104 mmHg 93 %   02/03/15 1602 100.1 ??F (37.8 ??C) 89 26 126/58 mmHg 95 %   02/03/15 1402 - 87 22 105/42 mmHg 95 %   02/03/15 1202 99 ??F (37.2 ??C) 82 20 105/54 mmHg 95 %   02/03/15 1002 - 86 20 100/47 mmHg 95 %       I/O:     Intake/Output Summary (Last 24 hours) at 02/04/15 0854  Last  data filed at 02/04/15 0557   Gross per 24 hour   Intake 1853.33 ml   Output   1500 ml   Net 353.33 ml         Examination:   Gen: Conversant, alert, oriented. No acute distress.   MSE: Grossly intact   Pulmonary: Clear to auscultation   Cardiac: Regular rate and rhythm   Abdomen:  Nondistended, soft, normal BS, mildly tender, no rebound, no guarding.      Labs: Reviewed:   Recent Labs      02/04/15   0815  02/04/15   0046  02/03/15   1629  02/03/15   0826  02/03/15   0041  02/02/15   2337   WBC   --   32.0*   --    --    --   43.0*   HCT  27.0*  29.1*  27.8*  28.9*  28.6*  30.3*      Recent Labs      02/04/15   0046  02/02/15   2337   BUN  15  16   CREA  0.9  1.2   K  4.5  4.5   NA  144  141   CL  111*  108*   CO2  24  25   GLU  110*  134*   ALB   --   3.4           Samuel Jester, MD  February 04, 2015  1:30 AM

## 2015-02-04 NOTE — Progress Notes (Signed)
Hospitalist Progress Note    Patient: Joanne Ruiz               Sex: female          DOA: 02/02/2015       Date of Birth:  [birth year]      Age:  74 y.o.        LOS:  LOS: 2 days     PCP: PROVIDER UNKNOWN   Assessment and Plan:   1.?? Colonic perforation.  -Appreciate Surgery evaluation.  -conservative Mx   -clinically stable  -trial of clear liquids today  -no fever, abdominal pain slow improvement, developed loose stools, non bloody  -Continue Empiric IV Abx.  -IV hydration.    2.??Suspected?? Sigmoid diverticulitis.  -Continue Abx.  -monitor Fever, WBC trend  -supportive care.    3.Pancreatic tail cystic mass  -outpt follow up.    4.Acute on chronic Anemia of chronic disease  -Iron deficiency anemia with Iv iron therapy as outpatient  -low Iron stores  -drop in H/H with no active bleeding.  -give one dose IV Iron.  -Hematology consult  -monitor H/H transfusion goal if Hb<7.0    5.?? Pulmonary hypertension  -wait Echocardiogram  -supplemental oxygen  - on Home Oxygen.    6.COPD  -Oxygen  -Neb PRN  -stable    7.Aortic stenosis  -wait echo.    Discussed with patient and son at bedside and updated.    Problem List:  Active Problems:    Perforated viscus (02/02/2015)              Subjective: abdominal pain slow improvement, dry mouth no appetite.     Ms. Clendenin is a 74 y.o. year old female who is being seen for abdominal pain    Objective: comfortable in bed,       Vital Signs:  Visit Vitals   Item Reading   ??? BP 110/50 mmHg   ??? Pulse 91   ??? Temp 100.4 ??F (38 ??C)   ??? Resp 19   ??? Ht  (1.575 m)   ??? Wt 74.844 kg (165 lb)   ??? BMI 30.17 kg/m2   ??? SpO2 97%   ??? Breastfeeding No       Physical Exam:  General appearance: alert, cooperative, no distres  Head: Normocephalic, , atraumatic  Eyes: conjunctiva pale, anicteric.  Lungs: clear to auscultation bilaterally  Heart: regular rate and rhythm, S1, S2 normal  Abdomen: soft, RLQ tender. Bowel sounds normal.  Extremities: no cyanosis or edema   Skin: Skin color, texture, turgor normal. No rashes or lesions  Neurologic: CN Grossly normal.    Intake and Output:  Last three shifts:  04/18 1901 - 04/20 0700  In: 2583.3 [I.V.:2583.3]  Out: 1950 [Urine:1950]    Lab Results:  Recent Results (from the past 24 hour(s))   IRON PROFILE    Collection Time: 02/03/15  4:29 PM   Result Value Ref Range    Iron <5 (L) 50 - 170 mcg/dl    TIBC 161 096 - 045 mcg/dl    Iron % saturation 2 (L) 20 - 45 %   HGB & HCT    Collection Time: 02/03/15  4:29 PM   Result Value Ref Range    HGB 7.9 (L) 13.0 - 17.2 gm/dl    HCT 40.9 (L) 81.1 - 50.0 %   CBC WITH AUTOMATED DIFF    Collection Time: 02/04/15 12:46 AM   Result Value Ref Range  WBC 32.0 (H) 4.0 - 11.0 1000/mm3    RBC 2.36 (L) 3.60 - 5.20 M/uL    HGB 8.0 (L) 13.0 - 17.2 gm/dl    HCT 09.829.1 (L) 11.937.0 - 50.0 %    MCV 123.3 (H) 80.0 - 98.0 fL    MCH 33.9 25.4 - 34.6 pg    MCHC 27.5 (L) 30.0 - 36.0 gm/dl    PLATELET 147429 829140 - 562450 1000/mm3    MPV 11.6 (H) 6.0 - 10.0 fL    RDW-SD 84.7 (HH) 36.4 - 46.3      NRBC 1 (H) 0 - 0      NEUTROPHILS 85.6 (H) 34 - 64 %    LYMPHOCYTES 1.0 (L) 28 - 48 %    MONOCYTES 1.0 1 - 13 %    EOSINOPHILS 1.0 0 - 5 %    BASOPHILS 1.9 0 - 3 %    BANDS 2.9 0 - 11 %    METAMYELOCYTES 1.0 (H) 0 - 0 %    MYELOCYTES 3.8 (H) 0 - 0 %    PROMYELOCYTES 1.0 (H) 0 - 0 %    UNIDENTIFIED MONONUCLEAR CELL 1.0 (H) 0 - 0 %    Poikilocytosis 1+      ANISOCYTOSIS 2+      POLYCHROMASIA 1+      Macrocytes 2+      Dacrocytes 1+      Elliptocytes 1+      Basophilic stippling 1+      PLATELET COMMENTS NORMAL     METABOLIC PANEL, BASIC    Collection Time: 02/04/15 12:46 AM   Result Value Ref Range    Sodium 144 136 - 145 mEq/L    Potassium 4.5 3.5 - 5.1 mEq/L    Chloride 111 (H) 98 - 107 mEq/L    CO2 24 21 - 32 mEq/L    Glucose 110 (H) 74 - 106 mg/dl    BUN 15 7 - 25 mg/dl    Creatinine 0.9 0.6 - 1.3 mg/dl    GFR est AA >13.0>60.0      GFR est non-AA >60      Calcium 8.1 (L) 8.5 - 10.1 mg/dl   HGB & HCT     Collection Time: 02/04/15  8:15 AM   Result Value Ref Range    HGB 7.6 (L) 13.0 - 17.2 gm/dl    HCT 86.527.0 (L) 78.437.0 - 50.0 %          Medications:  Current Facility-Administered Medications   Medication Dose Route Frequency   ??? iron sucrose (VENOFER) 300 mg in 0.9% sodium chloride 250 mL IVPB  300 mg IntraVENous ONCE   ??? morphine injection 4 mg  4 mg IntraVENous Q4H PRN   ??? ondansetron (ZOFRAN) injection 4 mg  4 mg IntraVENous Q4H PRN   ??? 0.9% sodium chloride infusion  100 mL/hr IntraVENous CONTINUOUS   ??? ertapenem (INVANZ) 1 g in 0.9% sodium chloride (MBP/ADV) 50 mL MBP  1 g IntraVENous Q24H   ??? albuterol CONCENTRATE 2.5mg /0.5 mL neb soln  2.5 mg Nebulization Q4H PRN   ??? ipratropium (ATROVENT) 0.02 % nebulizer solution 0.5 mg  0.5 mg Nebulization Q6H PRN   ??? HYDROmorphone (PF) (DILAUDID) injection 1 mg  1 mg IntraVENous Q3H PRN   ??? pantoprazole (PROTONIX) 40 mg in sodium chloride 0.9 % 10 mL injection  40 mg IntraVENous Q12H         Madaline SavageKulasegaram Emani Taussig, MD  February 04, 2015  9:55 AM

## 2015-02-05 LAB — CBC WITH AUTOMATED DIFF
BAND NEUTROPHILS: 10.6 % (ref 0–11)
BASOPHILS: 3.8 % — ABNORMAL HIGH (ref 0–3)
EOSINOPHILS: 1 % (ref 0–5)
HCT: 25.6 % — ABNORMAL LOW (ref 37.0–50.0)
HGB: 7.1 gm/dl — ABNORMAL LOW (ref 13.0–17.2)
LYMPHOCYTES: 3.8 % — ABNORMAL LOW (ref 28–48)
MCH: 34 pg (ref 25.4–34.6)
MCHC: 27.7 gm/dl — ABNORMAL LOW (ref 30.0–36.0)
MCV: 122.5 fL — ABNORMAL HIGH (ref 80.0–98.0)
METAMYELOCYTES: 2.9 % — ABNORMAL HIGH (ref 0–0)
MONOCYTES: 1.9 % (ref 1–13)
MPV: 11.4 fL — ABNORMAL HIGH (ref 6.0–10.0)
MYELOCYTES: 1 % — ABNORMAL HIGH (ref 0–0)
NEUTROPHILS: 74 % — ABNORMAL HIGH (ref 34–64)
NRBC: 0 (ref 0–0)
PLATELET COMMENTS: NORMAL
PLATELET: 373 10*3/uL (ref 140–450)
PROMYELOCYTES: 1 % — ABNORMAL HIGH (ref 0–0)
RBC: 2.09 M/uL — ABNORMAL LOW (ref 3.60–5.20)
RDW-SD: 82.6 — CR (ref 36.4–46.3)
WBC: 24.5 10*3/uL — ABNORMAL HIGH (ref 4.0–11.0)

## 2015-02-05 LAB — METABOLIC PANEL, BASIC
BUN: 12 mg/dl (ref 7–25)
CO2: 25 mEq/L (ref 21–32)
Calcium: 8.1 mg/dl — ABNORMAL LOW (ref 8.5–10.1)
Chloride: 112 mEq/L — ABNORMAL HIGH (ref 98–107)
Creatinine: 0.7 mg/dl (ref 0.6–1.3)
GFR est AA: >60
GFR est non-AA: >60
Glucose: 112 mg/dl — ABNORMAL HIGH (ref 74–106)
Potassium: 4.3 mEq/L (ref 3.5–5.1)
Sodium: 144 mEq/L (ref 136–145)

## 2015-02-05 LAB — TYPE + CROSSMATCH
Blood type code: 8400
Issue date/time: 201604210945
Specimen expiration date: 201604222359
Status info.: TRANSFUSED

## 2015-02-05 LAB — BLOOD TYPE CONFIRM.: ABO/Rh(D): AB POS

## 2015-02-05 MED ORDER — FUROSEMIDE 10 MG/ML IJ SOLN
10 mg/mL | Freq: Once | INTRAMUSCULAR | Status: AC
Start: 2015-02-05 — End: 2015-02-05
  Administered 2015-02-05: 16:00:00 via INTRAVENOUS

## 2015-02-05 MED ORDER — FUROSEMIDE 10 MG/ML IJ SOLN
10 mg/mL | Freq: Two times a day (BID) | INTRAMUSCULAR | Status: DC
Start: 2015-02-05 — End: 2015-02-06

## 2015-02-05 MED ORDER — SODIUM CHLORIDE 0.9 % IV
INTRAVENOUS | Status: DC | PRN
Start: 2015-02-05 — End: 2015-02-10

## 2015-02-05 MED FILL — PROTONIX 40 MG INTRAVENOUS SOLUTION: 40 mg | INTRAVENOUS | Qty: 40

## 2015-02-05 MED FILL — FUROSEMIDE 10 MG/ML IJ SOLN: 10 mg/mL | INTRAMUSCULAR | Qty: 2

## 2015-02-05 MED FILL — SODIUM CHLORIDE 0.9 % IV: INTRAVENOUS | Qty: 250

## 2015-02-05 MED FILL — ACETAMINOPHEN 325 MG TABLET: 325 mg | ORAL | Qty: 2

## 2015-02-05 MED FILL — INVANZ 1 GRAM SOLUTION FOR INJECTION: 1 gram | INTRAMUSCULAR | Qty: 1

## 2015-02-05 NOTE — Progress Notes (Signed)
Hospitalist Progress Note    Patient: Joanne Ruiz               Sex: female          DOA: 02/02/2015       Date of Birth:  October 30, 1940      Age:  74 y.o.        LOS:  LOS: 3 days     PCP: PROVIDER UNKNOWN   Assessment and Plan:   1.?? Colonic perforation.  -Appreciate Surgery evaluation.  -conservative Mx , clinically improving.??  -tolerated clear liquids slowly advance to full liquid.  -no fever, abdominal pain slow improvement  -Continue Empiric IV Abx.  -hold IV fluids for today. Need Diuresis with positive fluid balance if output charted correctly.    2.??Suspected?? Sigmoid diverticulitis.  -Continue Abx.  -monitor Fever, WBC trend  -supportive care.    3.Pulmonary hypertension  -Echocardiogram showing severe PTH with RVSP  -outpt Pulmonology follow up.  -supplemental oxygen  -stop Iv hydration  -stat on Lasix  BID (home dose  PO daily).    4.Acute on chronic Anemia of chronic disease with Myeloproliferative disorder.  -Iron deficiency anemia with Iv iron therapy as outpatient  -drop in H/H with no active bleeding.  -give one dose IV Iron given  -agree with 2 units PRBC with continued drop in H/H  -Hematology consult appreciated.    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    6.COPD  -Oxygen  -Neb PRN  -stable    7.Aortic stenosis  -with good cups mobility.    Problem List:  Active Problems:    Perforated viscus (02/02/2015)      Myeloproliferative disorder (HCC) (02/05/2015)      Neutrophilic leukocytosis (02/05/2015)                Subjective: feels better.     Ms. Joanne Ruiz is a 74 y.o. year old female who is being seen for abdominal pain.    Objective: mild short of breath.      Vital Signs:  Visit Vitals   Item Reading   ??? BP 106/61 mmHg   ??? Pulse 79   ??? Temp 98.1 ??F (36.7 ??C)   ??? Resp 16   ??? Ht  (1.575 m)   ??? Wt 74.844 kg (165 lb)   ??? BMI 30.17 kg/m2   ??? SpO2 98%   ??? Breastfeeding No       Physical Exam:   General appearance: Obese, pleasant, alert, cooperative, no distress  Head: Normocephalic, atraumatic  Lungs: clear to auscultation bilaterally decreased base air entry.  Heart: regular rate and rhythm, S1, S2 normal, no murmu  Abdomen: soft, mild hypogastric tenderness. Bowel sounds normal.  Extremities:  no cyanosis and trace edema    Intake and Output:  Last three shifts:  04/19 1901 - 04/21 0700  In: 4123 [P.O.:480; I.V.:3643]  Out: 650 [Urine:650]    Lab Results:  Recent Results (from the past 24 hour(s))   HGB & HCT    Collection Time: 02/04/15  4:05 PM   Result Value Ref Range    HGB 7.5 (L) 13.0 - 17.2 gm/dl    HCT 16.1 (L) 09.6 - 50.0 %   CBC WITH AUTOMATED DIFF    Collection Time: 02/05/15  2:17 AM   Result Value Ref Range    WBC 24.5 (H) 4.0 - 11.0 1000/mm3    RBC 2.09 (L) 3.60 - 5.20 M/uL  HGB 7.1 (L) 13.0 - 17.2 gm/dl    HCT 21.325.6 (L) 08.637.0 - 50.0 %    MCV 122.5 (H) 80.0 - 98.0 fL    MCH 34.0 25.4 - 34.6 pg    MCHC 27.7 (L) 30.0 - 36.0 gm/dl    PLATELET 578373 469140 - 629450 1000/mm3    MPV 11.4 (H) 6.0 - 10.0 fL    RDW-SD 82.6 (HH) 36.4 - 46.3      NRBC 0 0 - 0      NEUTROPHILS 74.0 (H) 34 - 64 %    LYMPHOCYTES 3.8 (L) 28 - 48 %    MONOCYTES 1.9 1 - 13 %    EOSINOPHILS 1.0 0 - 5 %    BASOPHILS 3.8 (H) 0 - 3 %    BANDS 10.6 0 - 11 %    METAMYELOCYTES 2.9 (H) 0 - 0 %    MYELOCYTES 1.0 (H) 0 - 0 %    PROMYELOCYTES 1.0 (H) 0 - 0 %    Poikilocytosis 1+      ANISOCYTOSIS 2+      POLYCHROMASIA 1+      Macrocytes 2+      Dacrocytes 1+      Elliptocytes 1+      Basophilic stippling 1+      PLATELET COMMENTS NORMAL      Stomatocytes 2+     METABOLIC PANEL, BASIC    Collection Time: 02/05/15  2:17 AM   Result Value Ref Range    Sodium 144 136 - 145 mEq/L    Potassium 4.3 3.5 - 5.1 mEq/L    Chloride 112 (H) 98 - 107 mEq/L    CO2 25 21 - 32 mEq/L    Glucose 112 (H) 74 - 106 mg/dl    BUN 12 7 - 25 mg/dl    Creatinine 0.7 0.6 - 1.3 mg/dl    GFR est AA >52.8>60.0      GFR est non-AA >60      Calcium 8.1 (L) 8.5 - 10.1 mg/dl    TYPE & CROSSMATCH    Collection Time: 02/05/15  8:25 AM   Result Value Ref Range    Product ID Red Blood Cells      Unit number [id number]      Crossmatch result Compatible      Status info. Ready      Product code [phone]      Blood type code 8400      Specimen expiration date [id number]     TYPE & CROSSMATCH    Collection Time: 02/05/15  8:30 AM   Result Value Ref Range    Product ID Red Blood Cells      Unit number [id number]      Crossmatch result Compatible      Status info. Issued      Product code [phone]      Blood type code 8400      Issue date/time [id number]      Specimen expiration date [id number]     BLOOD TYPE CONFIRM.    Collection Time: 02/05/15  8:49 AM   Result Value Ref Range    ABO/Rh(D) AB Rh Positive         Echo  Interpretation Summary  A complete two-dimensional transthoracic echocardiogram was performed (2D, M-mode, Doppler and color flow Doppler).  The study was technically difficult.  The calculated left ventricular ejection fraction is 64%.  Flattened septum is consistent with RV pressure/volume overload.  The  right ventricle is mildly dilated with normal systolic function..  A dilated inferior vena cava suggests increased right atrial pressure.  The right atrium is mildly dilated.  There is mild tricuspid regurgitation.  Based on the peak tricuspid regurgitation velocity the estimated right ventricular systolic pressure is 87 mmHg.  Doppler findings suggest 'severe' pulmonary hypertension.  The mitral valve leaflets are sclerotic with adequate mobility and trace regurgitation.  Sclerotic edge thickening of the aortic valve with adequate cusp mobility.  Mild pulmonic valvular regurgitation.  Trivial pericardial effusion which is hemodynamically insignificant.  There is no previous echo for comparison.  All other findings are noted below.    Medications:  Current Facility-Administered Medications   Medication Dose Route Frequency    ??? 0.9% sodium chloride infusion 250 mL  250 mL IntraVENous PRN   ??? furosemide (LASIX) injection 20 mg  20 mg IntraVENous ONCE   ??? acetaminophen (TYLENOL) tablet 650 mg  650 mg Oral Q6H PRN   ??? ondansetron (ZOFRAN) injection 4 mg  4 mg IntraVENous Q4H PRN   ??? ertapenem (INVANZ) 1 g in 0.9% sodium chloride (MBP/ADV) 50 mL MBP  1 g IntraVENous Q24H   ??? albuterol CONCENTRATE 2.5mg /0.5 mL neb soln  2.5 mg Nebulization Q4H PRN   ??? ipratropium (ATROVENT) 0.02 % nebulizer solution 0.5 mg  0.5 mg Nebulization Q6H PRN   ??? HYDROmorphone (PF) (DILAUDID) injection 1 mg  1 mg IntraVENous Q3H PRN   ??? pantoprazole (PROTONIX) 40 mg in sodium chloride 0.9 % 10 mL injection  40 mg IntraVENous Q12H         Madaline Savage, MD  February 05, 2015  10:54 AM

## 2015-02-05 NOTE — Other (Signed)
Bedside and Verbal shift change report given to CHASSIDY A KEAR, RN (oncoSharyn Lullming nurse) by Verneita GriffesLori King-Evans, RN  (offgoing nurse). Report included the following information SBAR, Kardex and MAR.

## 2015-02-05 NOTE — Progress Notes (Signed)
Hematology / Oncology Progress Note  [facility]        Patient: Joanne Ruiz  Gender: female   MRN: [id number]    Date of Birth: 12/24/1940 Age: 74 y.o.   CSN: [id number]    LOS:  LOS: 3 days   Admit Date: 02/02/2015    Assessment:     Active Problems:    Perforated viscus (02/02/2015)      Myeloproliferative disorder (HCC) (02/05/2015)      Neutrophilic leukocytosis (02/05/2015)        Plan:     Acute on chronic anemia worse. Will give 2 units PRBC.  Will not give her more IV iron since iron deficiency has been corrected.  Baseline WBC is about 20,000.  Cont abx and care for bowel perforation as per surgery and med.      Subjective:     Admitted for bowel perforation    Objective:     BP 120/61 mmHg   Pulse 82   Temp(Src) 97.2 ??F (36.2 ??C)   Resp 16   Ht  (1.575 m)   Wt 74.844 kg (165 lb)   BMI 30.17 kg/m2   SpO2 95%   Breastfeeding? No          Temp (24hrs), Avg:98.2 ??F (36.8 ??C), Min:97.2 ??F (36.2 ??C), Max:99.1 ??F (37.3 ??C)        Intake/Output Summary (Last 24 hours) at 02/05/15 0813  Last data filed at 02/05/15 8295   Gross per 24 hour   Intake 3269.66 ml   Output      0 ml   Net 3269.66 ml       Review of Systems:   Constitutional: negative for fevers, chills, sweats, has fatigue  Eyes: negative for visual disturbance, redness and icterus  Ears, Nose, Mouth, Throat, and Face: negative for tinnitus, epistaxis, sore mouth and hoarseness  Respiratory: negative for cough, sputum, hemoptysis, pleurisy/chest pain or wheezing  Cardiovascular: negative for chest pain, chest pressure/discomfort, palpitations, irregular heart beats, syncope, paroxysmal nocturnal dyspnea  Gastrointestinal:  Has some nausea, abd pain diarrhea.   Genitourinary:negative for dysuria, nocturia, urinary incontinence, hesitancy and hematuria  Integument: negative for rash, skin lesion(s) and pruritus  Hematologic/Lymphatic: negative for easy bruising, bleeding and lymphadenopathy   Musculoskeletal:negative for myalgias, arthralgias and bone pain  Neurological: negative for headaches, dizziness, seizures, paresthesia and weakness    Physical Exam:  Constitutional: Alert, oriented, not in distress  Eyes: PERRLA, anicteric, no redness  Ears, nose, mouth, throat, and face: no palpable Lymph nodes, no mucositis, no thrush.  Respiratory: symmetrical expansion, no rales, no rhonchi, no wheezing.  Cardiovascular: S1S2, no pathologic murmur, no rub.  Gastrointestinal: sl distended, tender with rebound tenderness.  Integument: no rash, no petechiae, no ecchymosis.  Musculoskeletal: no edema, no cyanosis, no clubbing.  Neurological: intact, cranial nerves, no focal motor or sensory deficits.    Labs:  Basic Metabolic Profile   Recent Labs      02/05/15   0217  02/04/15   0046  02/02/15   2337   NA  144  144  141   K  4.3  4.5  4.5   CL  112*  111*  108*   CO2  BUN  CREA  0.7  0.9  1.2   GLU  112*  110*  134*   CA  8.1*  8.1*  7.9*  CBC w/Diff    Recent Labs      02/05/15   0217  02/04/15   1605  02/04/15   0815  02/04/15   0046   02/02/15   2337   WBC  24.5*   --    --   32.0*   --   43.0*   HGB  7.1*  7.5*  7.6*  8.0*   < >  8.8*   HCT  25.6*  26.7*  27.0*  29.1*   < >  30.3*   PLT  373   --    --   429   --   435    < > = values in this interval not displayed.    Recent Labs      02/05/15   0217  02/04/15   0046   BANDS  10.6  2.9   GRANS  74.0*  85.6*        CHEMISTRY   Lab Results   Component Value Date    ALB 3.4 02/02/2015    TP 6.3* 02/02/2015    TBILI 0.8 02/02/2015    ALT 15 02/02/2015    SGOT 16 02/02/2015    AP 79 02/02/2015         Coagulation   No results found for: PTP, INR, APTT       Current Medications:   Current facility-administered medications:   ???  acetaminophen (TYLENOL) tablet 650 mg, 650 mg, Oral, Q6H PRN, Madaline SavageKulasegaram Skandaraj, MD, 650 mg at 02/04/15 2321  ???  ondansetron (ZOFRAN) injection 4 mg, 4 mg, IntraVENous, Q4H PRN, Candace CruiseEmily A Hahn, MD   ???  0.9% sodium chloride infusion, 100 mL/hr, IntraVENous, CONTINUOUS, Irving BurtonMuhamed Jasarevic, MD, Last Rate: 100 mL/hr at 02/05/15 0536, 100 mL/hr at 02/05/15 0536  ???  ertapenem (INVANZ) 1 g in 0.9% sodium chloride (MBP/ADV) 50 mL MBP, 1 g, IntraVENous, Q24H, Muhamed Jasarevic, MD, Last Rate: 100 mL/hr at 02/05/15 0056, 1 g at 02/05/15 0056  ???  albuterol CONCENTRATE 2.5mg /0.5 mL neb soln, 2.5 mg, Nebulization, Q4H PRN, Irving BurtonMuhamed Jasarevic, MD  ???  ipratropium (ATROVENT) 0.02 % nebulizer solution 0.5 mg, 0.5 mg, Nebulization, Q6H PRN, Irving BurtonMuhamed Jasarevic, MD  ???  HYDROmorphone (PF) (DILAUDID) injection 1 mg, 1 mg, IntraVENous, Q3H PRN, Irving BurtonMuhamed Jasarevic, MD, 1 mg at 02/04/15 0040  ???  pantoprazole (PROTONIX) 40 mg in sodium chloride 0.9 % 10 mL injection, 40 mg, IntraVENous, Q12H, Irving BurtonMuhamed Jasarevic, MD, 40 mg at 02/05/15 16100812    Catalina AntiguaVALIANT D Hilja Kintzel, MD   [facility]  Office [phone]

## 2015-02-05 NOTE — Progress Notes (Signed)
Progress Note    Patient: Joanne Ruiz               Sex: female          DOA: 02/02/2015       Date of Birth:  July 19, 1941      Age:  74 y.o.        LOS:  LOS: 3 days       Assessment and Plan:   1.?? Perforated viscus, likely but not definitively diverticulitis.?? Very very scant pneumoperitoneum on CT scan (several tiny bubbles.)?? Hemodynamically stable. ??  ??- Pain continuing to improve with IV antibiotics and bowel rest.?? Exam improving. Remains afebrile, hemodynamically stable.?? Leukocytosis improving.?? ??  - No plan for operative intervention- this is very likely to resolve non-operatively.?? ??  - Continue IV abx (ertapenem appropriate), bowel rest, and judicious pain control until pain mild.?? Tolerating some clears. Cont. For now. Multiple liquid BMs overnight. Possibly abx related but will check for c. Diff as well.  - Should eventually have colonoscopy as outpatient (~6 weeks)    2.?? 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 With ET there is a risk of leukemoid transformation as well as development of acquired Von Willebrand's- need to find out her current status- crucial information should surgery eventually become necessary.??   - Spoke with patient's PCP, Dr. Shari HeritageJamie Fountain [phone]).?? She reports patient has chronic iron deficiency anemia, requiring intermittent IV iron infusions, refuses or doesn't tolerate po iron.?? Has refused endoscopy/colonoscopy in workup.?? Also has recently increasing WBC (since October 2015), ranging 15-20K.?? ??  -Evaluated by Dr. Jodie Echevariaan from oncology. Pt. To receive 2U PRBCs today for acute on chronic anemia.  -Weakness. Likely from anemia. Will order PT eval.  3. COPD, O2 dependent- mgmt per hospitalist.  4. Pulmonary hypertension- mgmt per hospitalist. CXR tomorrow am.  5. Aortic sclerosis - mgmt per hospitalist.  6. History of cervical cancer, s/p chemo/EBRT about 10 years ago -  according to Dr. Leota JacobsenFountain had some chest lesion she deferred further w/u for.            Subjective:     Ms. Joanne Ruiz is a 74 y.o. year old female whom I have seen and examined.    Feels weak but better than yesterday. Multiple liquid BMs overnight and yesterday. Tolerating some clears.       Objective:      Vital Signs:  Visit Vitals   Item Reading   ??? BP 106/61 mmHg   ??? Pulse 79   ??? Temp 98.1 ??F (36.7 ??C)   ??? Resp 16   ??? Ht 5\' 2"  (1.575 m)   ??? Wt 74.844 kg (165 lb)   ??? BMI 30.17 kg/m2   ??? SpO2 98%   ??? Breastfeeding No       PHYSICAL EXAM:  GEN: NAD  CV:   PULM:   ABD: soft, ND, min. TTP throughout.  SKIN: Normothermic, no rash  EXT: no pitting edema, pedal pulses intact    Intake and Output:  Last three shifts:  04/19 1901 - 04/21 0700  In: 4123 [P.O.:480; I.V.:3643]  Out: 650 [Urine:650]    Lab Results:  Recent Results (from the past 24 hour(s))   HGB & HCT    Collection Time: 02/04/15  4:05 PM   Result Value Ref Range    HGB 7.5 (L) 13.0 - 17.2 gm/dl    HCT 56.226.7 (L) 13.037.0 - 50.0 %   CBC WITH AUTOMATED  DIFF    Collection Time: 02/05/15  2:17 AM   Result Value Ref Range    WBC 24.5 (H) 4.0 - 11.0 1000/mm3    RBC 2.09 (L) 3.60 - 5.20 M/uL    HGB 7.1 (L) 13.0 - 17.2 gm/dl    HCT 16.1 (L) 09.6 - 50.0 %    MCV 122.5 (H) 80.0 - 98.0 fL    MCH 34.0 25.4 - 34.6 pg    MCHC 27.7 (L) 30.0 - 36.0 gm/dl    PLATELET 045 409 - 811 1000/mm3    MPV 11.4 (H) 6.0 - 10.0 fL    RDW-SD 82.6 (HH) 36.4 - 46.3      NRBC 0 0 - 0      NEUTROPHILS 74.0 (H) 34 - 64 %    LYMPHOCYTES 3.8 (L) 28 - 48 %    MONOCYTES 1.9 1 - 13 %    EOSINOPHILS 1.0 0 - 5 %    BASOPHILS 3.8 (H) 0 - 3 %    BANDS 10.6 0 - 11 %    METAMYELOCYTES 2.9 (H) 0 - 0 %    MYELOCYTES 1.0 (H) 0 - 0 %    PROMYELOCYTES 1.0 (H) 0 - 0 %    Poikilocytosis 1+      ANISOCYTOSIS 2+      POLYCHROMASIA 1+      Macrocytes 2+      Dacrocytes 1+      Elliptocytes 1+      Basophilic stippling 1+      PLATELET COMMENTS NORMAL      Stomatocytes 2+     METABOLIC PANEL, BASIC     Collection Time: 02/05/15  2:17 AM   Result Value Ref Range    Sodium 144 136 - 145 mEq/L    Potassium 4.3 3.5 - 5.1 mEq/L    Chloride 112 (H) 98 - 107 mEq/L    CO2 25 21 - 32 mEq/L    Glucose 112 (H) 74 - 106 mg/dl    BUN 12 7 - 25 mg/dl    Creatinine 0.7 0.6 - 1.3 mg/dl    GFR est AA >91.4      GFR est non-AA >60      Calcium 8.1 (L) 8.5 - 10.1 mg/dl   TYPE & CROSSMATCH    Collection Time: 02/05/15  8:25 AM   Result Value Ref Range    Product ID Red Blood Cells      Unit number [id number]      Crossmatch result Compatible      Status info. Ready      Product code [phone]      Blood type code 8400      Specimen expiration date [id number]     TYPE & CROSSMATCH    Collection Time: 02/05/15  8:30 AM   Result Value Ref Range    Product ID Red Blood Cells      Unit number [id number]      Crossmatch result Compatible      Status info. Issued      Product code [phone]      Blood type code 8400      Issue date/time [id number]      Specimen expiration date [id number]     BLOOD TYPE CONFIRM.    Collection Time: 02/05/15  8:49 AM   Result Value Ref Range    ABO/Rh(D) AB Rh Positive         Images:  No results found.  Medications:  Current Facility-Administered Medications   Medication Dose Route Frequency   ??? 0.9% sodium chloride infusion 250 mL  250 mL IntraVENous PRN   ??? furosemide (LASIX) injection 20 mg  20 mg IntraVENous ONCE   ??? furosemide (LASIX) injection 20 mg  20 mg IntraVENous BID   ??? acetaminophen (TYLENOL) tablet 650 mg  650 mg Oral Q6H PRN   ??? ondansetron (ZOFRAN) injection 4 mg  4 mg IntraVENous Q4H PRN   ??? ertapenem (INVANZ) 1 g in 0.9% sodium chloride (MBP/ADV) 50 mL MBP  1 g IntraVENous Q24H   ??? albuterol CONCENTRATE 2.5mg /0.5 mL neb soln  2.5 mg Nebulization Q4H PRN   ??? ipratropium (ATROVENT) 0.02 % nebulizer solution 0.5 mg  0.5 mg Nebulization Q6H PRN   ??? HYDROmorphone (PF) (DILAUDID) injection 1 mg  1 mg IntraVENous Q3H PRN    ??? pantoprazole (PROTONIX) 40 mg in sodium chloride 0.9 % 10 mL injection  40 mg IntraVENous Q12H         Jeral Pinch, MD  February 05, 2015  11:40 AM

## 2015-02-06 ENCOUNTER — Inpatient Hospital Stay: Admit: 2015-02-06 | Payer: MEDICARE | Primary: Student in an Organized Health Care Education/Training Program

## 2015-02-06 LAB — CBC WITH AUTOMATED DIFF
BAND NEUTROPHILS: 4.8 % (ref 0–11)
BASOPHILS: 1 % (ref 0–3)
EOSINOPHILS: 1.9 % (ref 0–5)
HCT: 28.5 % — ABNORMAL LOW (ref 37.0–50.0)
HGB: 8.4 gm/dl — ABNORMAL LOW (ref 13.0–17.2)
LYMPHOCYTES: 1.9 % — ABNORMAL LOW (ref 28–48)
MCH: 33.5 pg (ref 25.4–34.6)
MCHC: 29.5 gm/dl — ABNORMAL LOW (ref 30.0–36.0)
MCV: 113.5 fL — ABNORMAL HIGH (ref 80.0–98.0)
METAMYELOCYTES: 1 % — ABNORMAL HIGH (ref 0–0)
MONOCYTES: 1.9 % (ref 1–13)
MPV: 11.6 fL — ABNORMAL HIGH (ref 6.0–10.0)
MYELOCYTES: 4.8 % — ABNORMAL HIGH (ref 0–0)
NEUTROPHILS: 82.7 % — ABNORMAL HIGH (ref 34–64)
NRBC: 0 (ref 0–0)
PLATELET COMMENTS: NORMAL
PLATELET: 345 10*3/uL (ref 140–450)
RBC: 2.51 M/uL — ABNORMAL LOW (ref 3.60–5.20)
RDW-SD: 87.9 — CR (ref 36.4–46.3)
WBC: 25.1 10*3/uL — ABNORMAL HIGH (ref 4.0–11.0)

## 2015-02-06 LAB — TYPE + CROSSMATCH
Blood type code: 8400
Issue date/time: 201604211446
Specimen expiration date: 201604222359
Status info.: TRANSFUSED

## 2015-02-06 LAB — METABOLIC PANEL, BASIC
BUN: 8 mg/dl (ref 7–25)
CO2: 25 mEq/L (ref 21–32)
Calcium: 7.9 mg/dl — ABNORMAL LOW (ref 8.5–10.1)
Chloride: 111 mEq/L — ABNORMAL HIGH (ref 98–107)
Creatinine: 0.7 mg/dl (ref 0.6–1.3)
GFR est AA: >60
GFR est non-AA: >60
Glucose: 114 mg/dl — ABNORMAL HIGH (ref 74–106)
Potassium: 3.7 mEq/L (ref 3.5–5.1)
Sodium: 143 mEq/L (ref 136–145)

## 2015-02-06 LAB — MAGNESIUM: Magnesium: 2 mg/dl (ref 1.8–2.4)

## 2015-02-06 MED ORDER — CIPROFLOXACIN IN D5W 400 MG/200 ML IV PIGGY BACK
400 mg/200 mL | Freq: Two times a day (BID) | INTRAVENOUS | Status: DC
Start: 2015-02-06 — End: 2015-02-08
  Administered 2015-02-06 – 2015-02-08 (×5): via INTRAVENOUS

## 2015-02-06 MED ORDER — FUROSEMIDE 10 MG/ML IJ SOLN
10 mg/mL | Freq: Every day | INTRAMUSCULAR | Status: DC
Start: 2015-02-06 — End: 2015-02-07
  Administered 2015-02-06: 12:00:00 via INTRAVENOUS

## 2015-02-06 MED ORDER — METRONIDAZOLE IN SODIUM CHLORIDE (ISO-OSM) 500 MG/100 ML IV PIGGY BACK
500 mg/100 mL | Freq: Three times a day (TID) | INTRAVENOUS | Status: DC
Start: 2015-02-06 — End: 2015-02-09
  Administered 2015-02-06 – 2015-02-09 (×9): via INTRAVENOUS

## 2015-02-06 MED ORDER — FUROSEMIDE 10 MG/ML IJ SOLN
10 mg/mL | Freq: Two times a day (BID) | INTRAMUSCULAR | Status: DC
Start: 2015-02-06 — End: 2015-02-06

## 2015-02-06 MED ORDER — SODIUM CHLORIDE 0.9 % IJ SYRG
INTRAMUSCULAR | Status: AC
Start: 2015-02-06 — End: 2015-02-06
  Administered 2015-02-07: 01:00:00

## 2015-02-06 MED ORDER — SODIUM CHLORIDE 0.9 % IJ SYRG
INTRAMUSCULAR | Status: AC
Start: 2015-02-06 — End: 2015-02-06
  Administered 2015-02-06: 12:00:00

## 2015-02-06 MED FILL — CIPROFLOXACIN IN D5W 400 MG/200 ML IV PIGGY BACK: 400 mg/200 mL | INTRAVENOUS | Qty: 200

## 2015-02-06 MED FILL — FUROSEMIDE 10 MG/ML IJ SOLN: 10 mg/mL | INTRAMUSCULAR | Qty: 4

## 2015-02-06 MED FILL — METRONIDAZOLE IN SODIUM CHLORIDE (ISO-OSM) 500 MG/100 ML IV PIGGY BACK: 500 mg/100 mL | INTRAVENOUS | Qty: 100

## 2015-02-06 MED FILL — HYDROMORPHONE (PF) 1 MG/ML IJ SOLN: 1 mg/mL | INTRAMUSCULAR | Qty: 1

## 2015-02-06 MED FILL — ACETAMINOPHEN 325 MG TABLET: 325 mg | ORAL | Qty: 2

## 2015-02-06 MED FILL — BD POSIFLUSH NORMAL SALINE 0.9 % INJECTION SYRINGE: INTRAMUSCULAR | Qty: 10

## 2015-02-06 MED FILL — FUROSEMIDE 10 MG/ML IJ SOLN: 10 mg/mL | INTRAMUSCULAR | Qty: 2

## 2015-02-06 MED FILL — BD POSIFLUSH NORMAL SALINE 0.9 % INJECTION SYRINGE: INTRAMUSCULAR | Qty: 20

## 2015-02-06 MED FILL — PROTONIX 40 MG INTRAVENOUS SOLUTION: 40 mg | INTRAVENOUS | Qty: 40

## 2015-02-06 NOTE — Other (Addendum)
----------  DocumentID:   [id number]------------------------------------------------              [facility]                       Patient Education Report         Name: Hurshel PartyLLINGTON, Nikyah                  Date: 02/04/2015    MRN: [id number]                    Time: 3:27:40 AM         Patient ordered video: Patient Safety: Stay Safe While you are in the   Hospital    from [id number] via phone number: [phone] at 3:27:40 AM    ----------DocumentID:   [id number]------------------------------------------------              [facility]                       Patient Education Report         Name: Hurshel PartyLLINGTON, Ambreen                  Date: 02/04/2015    MRN: [id number]                    Time: 3:27:40 AM         Patient ordered video: Patient Safety: Stay Safe While you are in the   Hospital    from [id number] via phone number: [phone] at 3:27:40 AM

## 2015-02-06 NOTE — Other (Signed)
Bedside and Verbal shift change report given to Lori (oncoming nurse) by Haise (offgoing nurse). Report included the following information SBAR, Kardex, MAR and Recent Results.

## 2015-02-06 NOTE — Progress Notes (Addendum)
Hematology / Oncology Progress Note  [facility]        Patient: Joanne Ruiz  Gender: female   MRN: [id number]    Date of Birth: November 14, 1940 Age: 74 y.o.   CSN: [id number]    LOS:  LOS: 4 days   Admit Date: 02/02/2015    Assessment:     Active Problems:    Perforated viscus (02/02/2015)      Myeloproliferative disorder (HCC) (02/05/2015)      Neutrophilic leukocytosis (02/05/2015)        Plan:     1. Dr. Jodie Echevariaan to follow.  2. Acute on chronic anemia worse--will monitor and transfuse PRN.  3. Baseline WBC is about 20,000--will monitor  4. Cont abx and care for bowel perforation as per surgery and med. If surgery indicated then ok from hematology standpoint.    Subjective:     Pt still having abd discomfort.  Appetite is poor.  No nausea.  Pt is weak and tired.    Objective:     BP 133/64 mmHg   Pulse 81   Temp(Src) 98.3 ??F (36.8 ??C)   Resp 19   Ht 5\' 2"  (1.575 m)   Wt 74.844 kg (165 lb)   BMI 30.17 kg/m2   SpO2 94%   Breastfeeding? No          Temp (24hrs), Avg:98 ??F (36.7 ??C), Min:97.2 ??F (36.2 ??C), Max:98.5 ??F (36.9 ??C)        Intake/Output Summary (Last 24 hours) at 02/06/15 0731  Last data filed at 02/05/15 1727   Gross per 24 hour   Intake 1000.9 ml   Output    200 ml   Net  800.9 ml       Review of Systems:   Constitutional: negative for fevers, chills, sweats and fatigue  Eyes: negative for visual disturbance, redness and icterus  Ears, Nose, Mouth, Throat, and Face: negative for tinnitus, epistaxis, sore mouth and hoarseness  Respiratory: negative for cough, sputum, hemoptysis, pleurisy/chest pain or wheezing  Cardiovascular: negative for chest pain, chest pressure/discomfort, palpitations, irregular heart beats, syncope, paroxysmal nocturnal dyspnea  Gastrointestinal: negative for reflux symptoms, nausea, vomiting, change in bowel habits, melena, diarrhea, constipation and abdominal pain  Genitourinary:negative for dysuria, nocturia, urinary incontinence, hesitancy and hematuria   Integument: negative for rash, skin lesion(s) and pruritus  Hematologic/Lymphatic: negative for easy bruising, bleeding and lymphadenopathy  Musculoskeletal:negative for myalgias, arthralgias and bone pain  Neurological: negative for headaches, dizziness, seizures, paresthesia and weakness    Physical Exam:  Constitutional: Alert, oriented, not in distress  Eyes: PERRLA, anicteric, no redness  Ears, nose, mouth, throat, and face: no palpable Lymph nodes, no mucositis, no thrush.  Respiratory: symmetrical expansion, no rales, no rhonchi, no wheezing.  Cardiovascular: S1S2, no pathologic murmur, no rub.  Gastrointestinal: soft, benign, non tender, no HSM, normal bowel sounds, no mass.  Integument: no rash, no petechiae, no ecchymosis.  Musculoskeletal: no edema, no cyanosis, no clubbing.  Neurological: intact, cranial nerves, no focal motor or sensory deficits.    Labs:  Basic Metabolic Profile   Recent Labs      02/06/15   0315  02/05/15   0217  02/04/15   0046   NA  143  144  144   K  3.7  4.3  4.5   CL  111*  112*  111*   CO2  25  25  24    BUN  8  12  15    CREA  0.7  0.7  0.9   GLU  114*  112*  110*   CA  7.9*  8.1*  8.1*   MG  2.0   --    --         CBC w/Diff    Recent Labs      02/06/15   0315  02/05/15   0217  02/04/15   1605   02/04/15   0046   WBC  25.1*  24.5*   --    --   32.0*   HGB  8.4*  7.1*  7.5*   < >  8.0*   HCT  28.5*  25.6*  26.7*   < >  29.1*   PLT  345  373   --    --   429    < > = values in this interval not displayed.    Recent Labs      02/06/15   0315  02/05/15   0217  02/04/15   0046   BANDS  4.8  10.6  2.9   GRANS  82.7*  74.0*  85.6*        CHEMISTRY   Lab Results   Component Value Date    ALB 3.4 02/02/2015    TP 6.3* 02/02/2015    TBILI 0.8 02/02/2015    ALT 15 02/02/2015    SGOT 16 02/02/2015    AP 79 02/02/2015         Coagulation   No results found for: PTP, INR, APTT       Current Medications:   Current facility-administered medications:    ???  furosemide (LASIX) injection 40 mg, 40 mg, IntraVENous, BID, Kulasegaram Skandaraj, MD  ???  ciprofloxacin (CIPRO) 400 mg IVPB (premix), 400 mg, IntraVENous, Q12H, Kulasegaram Skandaraj, MD  ???  metroNIDAZOLE (FLAGYL) IVPB premix 500 mg, 500 mg, IntraVENous, Q8H, Kulasegaram Skandaraj, MD  ???  0.9% sodium chloride infusion 250 mL, 250 mL, IntraVENous, PRN, Catalina Antigua, MD  ???  acetaminophen (TYLENOL) tablet 650 mg, 650 mg, Oral, Q6H PRN, Madaline Savage, MD, 650 mg at 02/06/15 0701  ???  ondansetron (ZOFRAN) injection 4 mg, 4 mg, IntraVENous, Q4H PRN, Candace Cruise, MD  ???  albuterol CONCENTRATE 2.5mg /0.5 mL neb soln, 2.5 mg, Nebulization, Q4H PRN, Irving Burton, MD  ???  ipratropium (ATROVENT) 0.02 % nebulizer solution 0.5 mg, 0.5 mg, Nebulization, Q6H PRN, Irving Burton, MD  ???  HYDROmorphone (PF) (DILAUDID) injection 1 mg, 1 mg, IntraVENous, Q3H PRN, Irving Burton, MD, 1 mg at 02/04/15 0040  ???  pantoprazole (PROTONIX) 40 mg in sodium chloride 0.9 % 10 mL injection, 40 mg, IntraVENous, Q12H, Irving Burton, MD, 40 mg at 02/05/15 2117    AIMEE Nino Glow   [facility] [phone]    Agree with above.  No Hematologic contraindication for surgery if needed.  Transfuse prn.  Brand Males, MD

## 2015-02-06 NOTE — Progress Notes (Signed)
Problem: Mobility Impaired (Adult and Pediatric)  Goal: *Acute Goals and Plan of Care (Insert Text)  To return to PLOF:  Pt will be I with all bed mobility  Pt with be I with all tranfers,  Pt will ambulate x500??? with LRAD and I  Pt will be I with HEP  Pt will negotiate 14 steps with I  PHYSICAL THERAPY EVALUATION    Patient: Joanne Ruiz ([age] y.o. female)  Room: 5211/5211    Date: 02/06/2015  Start Time:  815  End Time:  834    Primary Diagnosis: Perforated viscus          Precautions: Falls.Fall.  Weight bearing precautions: None        ASSESSMENT :  Based on the objective data described below, the patient presents with   Decreased Transfer Abilities  Decreased Ambulation Ability/Technique  Decreased Activity Tolerance  Decreased Pacing Skills.        Patient???s rehabilitation potential is considered to be Good     Recommendations:  Physical Therapy  Discharge Recommendations: SNF, Home with home health PT and TBD, pending progress  Further Equipment Recommendations for Discharge: Rolling walker          PLAN :  Planned Interventions:  Functional mobility training Gait Training Stair training Balance Training Therapeutic exercises Therapeutic activities Neuro muscular re-education AD training Patient/caregiver education      Frequency/Duration: Patient will be followed by physical therapy 3x / Week to address goals.                      Orders reviewed, chart reviewed on Joanne Ruiz.      SUBJECTIVE:   Patient: "I am doing ok"      OBJECTIVE DATA SUMMARY:   Present illness history:   Problem List  Never Reviewed           Codes Class Noted     Myeloproliferative disorder (HCC) ICD-10-CM: D47.1  ICD-9-CM: 238.79   02/05/2015           Neutrophilic leukocytosis ICD-10-CM: D72.9  ICD-9-CM: 288.8   02/05/2015           Perforated viscus ICD-10-CM: R19.8  ICD-9-CM: 799.89   02/02/2015                Past Medical history:   Past Medical History   Diagnosis Date   ??? Chronic obstructive pulmonary disease (HCC)      ??? Hypertension     ??? Anemia     ??? Thrombocytosis (HCC)     ??? Cervical cancer (HCC)     ??? Spinal stenosis     ??? Hydronephrosis     ??? Osteoporosis     ??? Detached retina     ??? Goiter     ??? Pulmonary HTN (HCC)     ??? Nocturnal hypoxia     ??? Aortic sclerosis (HCC)         Prior Level of Function/Home Situation:   Home environment: Lives alone, 1 story, Entrance steps 14.  Prior level of function: independent without limitations and Independent with ambulation  Home equipment: None      Patient found: Bed and EOB.    Pain Assessment before PT session: 5/10  Pain Location: abdomen  Pain Assessment after PT session: None reported  Pain Location:    [ ]           Yes, patient had pain medications  [ ]   No, Patient has not had pain medications            Nurse notified      COGNITIVE STATUS:     Mental Status: Oriented x3.  Communication: normal.  Follows commands: intact.  General Cognition: intact .  Hearing: grossly intact.  Vision:  grossly intact.        EXTREMITIES ASSESSMENT:      Tone & Sensation:   Normal    Proprioception:   Intact    Strength:    bilateral, upper extremity(s), lower extremity(s), grossly WFL throughout      Range Of Motion:  bilateral, upper extremity(s), lower extremity(s), grosly WNL        Functional mobility and balance status:     Sit to Stand -  standby assist  Stand to Sit -  standby assist        Balance:   Static Sitting Balance -  good  Dynamic Sitting Balance -  good  Static Standing Balance -  fair  Dynamic Standing Balance -  fair-    Single leg stance:     Ambulation/Gait Training:  steady, unsteady, reciprocal, decreased cadence, decreased step height/length, downward gaze, flexed posture and rest break(s) required Rolling walker standby assist  90 feet with 2 standing rest breaks    Stair Training:     steps      Therapeutic Exercises:    Patient received/participated in 10 minutes of treatment (gait training) immediately following evaluation and/or educational instruction  during/immediately following PT evaluation.    Lower Extremities:        Balance Activities:     Activity Tolerance:   fair    Final Location:   bed, EOB, all needs close, agrees to call for assistance and nurse notified       COMMUNICATION/EDUCATION:   Education: Patient, Benefit of activity while hospitalized, Call for assistance, Out of bed 2-3 times/day, Staff assistance with mobility, Changes positions frequently, Sit out of bed for 45-60 minutes or as tolerated, Safety, Functional mobility, Demonstrates adequately, Verbalized understanding, Teaching method, Verbal and Role of PT  Barriers to Learning/Limitations: None    Please refer to care plan and patient education section for further details.    Thank you for this referral.  Darnelle Going, PT, DPT  Pager [phone]

## 2015-02-06 NOTE — Progress Notes (Signed)
SW spoke w/ pt and pt's Samuella Cotaim Vanscyoc  At the bed side.  SW explained the need for a PASSAR to be completed incase pt will need snf placement. Pt in agreement with PASSAR completed and agreeable that her son, Samuella Cotaim Hue could be placed on the form as a ER contact - Samuella Cotaim Pitz, [address], [place], [place] [id number] [phone]

## 2015-02-06 NOTE — Progress Notes (Signed)
[facility]  Colon and Rectal Surgery  [phone]    CONSULT NOTE    Today's date: February 06, 2015  Patient: Joanne Ruiz                 Sex: female        Date of Birth:  Apr 12, 1941      Age:  74 y.o.          DOA: 02/02/2015  LOS:  LOS: 4 days         Assessment/Plan     1.?? Perforated viscus, likely but not definitively diverticulitis.?? Very very scant pneumoperitoneum on CT scan (several tiny bubbles.)?? Hemodynamically stable.    - Responding well  IV antibiotics, continuing to improve.  Tolerating clears.?? Exam improving.  Remains afebrile, hemodynamically stable.  Leukocytosis stable  - No plan for operative intervention- this is very likely to resolve non-operatively.??  Will sign off at this point, always available for re-consult if condition changes.  No need for surgery follow up.  Does need to follow up with PCP to discuss colonoscopy as outpatient (`6 weeks).  - diarrhea yesterday, none recently.  No c.diff obtained (no more BM's)     2.?? 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 Followed by [facility].  3. COPD, O2 dependent- mgmt per hospitalist.  4. Pulmonary hypertension- mgmt per hospitalist.  5. Aortic sclerosis - mgmt per hospitalist.  6. History of cervical cancer, s/p chemo/EBRT about 10 years ago - according to Dr. Leota JacobsenFountain had some chest lesion she deferred further w/u for.      Subjective:   Feels ok, weak.  Pain mild.  No more BM's, feels bloated.   Vitals:     Patient Vitals for the past 24 hrs:   Temp Pulse Resp BP SpO2   02/06/15 1141 98.5 ??F (36.9 ??C) 79 18 118/59 mmHg 99 %   02/06/15 0853 97.3 ??F (36.3 ??C) 84 18 117/57 mmHg 91 %   02/05/15 2342 98.3 ??F (36.8 ??C) 81 19 133/64 mmHg 94 %   02/05/15 2048 98.5 ??F (36.9 ??C) 86 19 120/56 mmHg 96 %   02/05/15 1517 97.4 ??F (36.3 ??C) 80 16 120/54 mmHg 100 %   02/05/15 1442 98.2 ??F (36.8 ??C) 81 17 127/63 mmHg 97 %       I/O:        Intake/Output Summary (Last 24 hours) at 02/06/15 1153  Last data filed at 02/06/15 1120   Gross per 24 hour   Intake 1100.9 ml   Output    900 ml   Net  200.9 ml         Examination:   Gen: Conversant, alert, oriented. No acute distress.   MSE: Grossly intact   Pulmonary: Clear to auscultation   Cardiac: Regular rate and rhythm   Abdomen:  Nondistended, soft, normal BS, minimally tender, no rebound, no guarding.      Labs: Reviewed:     Recent Labs      02/06/15   0315  02/05/15   0217  02/04/15   1605  02/04/15   0815  02/04/15   0046  02/03/15   1629   WBC  25.1*  24.5*   --    --   32.0*   --    HCT  28.5*  25.6*  26.7*  27.0*  29.1*  27.8*        Recent Labs      02/06/15  6045  02/05/15   0217  02/04/15   0046   BUN  CREA  0.7  0.7  0.9   K  3.7  4.3  4.5   NA  143  144  144   CL  111*  112*  111*   CO2  GLU  114*  112*  110*           Samuel Jester, MD  February 06, 2015  4:09 AM

## 2015-02-06 NOTE — Progress Notes (Signed)
Hospitalist Progress Note    Patient: Joanne Ruiz               Sex: female          DOA: 02/02/2015       Date of Birth:  10-Aug-1941      Age:  74 y.o.        LOS:  LOS: 4 days     PCP: PROVIDER UNKNOWN   Assessment and Plan:   1.?? Colonic perforation.  -Appreciate Surgery recommendation.  -conservative Mx ,continues to have localized pain.??  -Bowel rest.  -no fever, Baseline high WBC from chronic myeloproliferative disorder.  -tolerated clear liquids   .-no fever, abdominal pain slow improvement  -Continue Empiric IV Abx. Switched to IV Cipro and Flagyl  -continues loose sttols, agree with checking for C.diff, would switch Abx to Cipro and Falgyl.    2.??Suspected?? Sigmoid diverticulitis.  -Continue Abx, switch to Cipro and Flagyl.  -monitor Fever, WBC trend  -supportive care.    3.Chronic severe Pulmonary hypertension  -Echocardiogram showing severe PTH with RVSP 87mmHg  -outpt Pulmonology follow up.  -supplemental oxygen  -LASIX 40MG  iv DAILY  -CXR showing mild pulm congestion.    4.Acute on chronic Anemia of chronic disease with Myeloproliferative disorder.  -drop in H/H with no active bleeding, improved after 2 unit PRBC yesterday.  -give one dose IV Iron given this admission.  -Hematology consult appreciated.  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    6.COPD  -Oxygen (son states Patient is non complaint)  -Neb PRN  -stable    7.Aortic stenosis  -with good cups mobility.    D/W surgery.  Problem List:  Active Problems:    Perforated viscus (02/02/2015)      Myeloproliferative disorder (HCC) (02/05/2015)      Neutrophilic leukocytosis (02/05/2015)              Subjective: complains abdominal pain and diarrhea.     Joanne Ruiz is a 74 y.o. year old female who is being seen for bowel perforation.    Objective: not in distress.      Vital Signs:  Visit Vitals   Item Reading   ??? BP 133/64 mmHg   ??? Pulse 81   ??? Temp 98.3 ??F (36.8 ??C)   ??? Resp 19   ??? Ht 5\' 2"  (1.575 m)    ??? Wt 74.844 kg (165 lb)   ??? BMI 30.17 kg/m2   ??? SpO2 94%   ??? Breastfeeding No       Physical Exam:  General appearance: alert, cooperative, no distress  Head: Normocephalic,  atraumatic  Lungs:decreased basal air entry.  Heart: regular rate and rhythm, S1, S2 normal, no murmur.  Abdomen: soft, tender.   Extremities:  no cyanosis or edema  Neurologic: CN Grossly normal, Extremity strength bilaterally 5/5    Intake and Output:  Last three shifts:  04/20 1901 - 04/22 0700  In: 2239.2 [P.O.:340; I.V.:1238.3]  Out: 200 [Urine:200]    Lab Results:  Recent Results (from the past 24 hour(s))   TYPE & CROSSMATCH    Collection Time: 02/05/15  8:25 AM   Result Value Ref Range    Product ID Red Blood Cells      Unit number [id number]      Crossmatch result Compatible      Status info. Transfused      Product code [id number]      Blood type  code 8400      Issue date/time [id number]      Specimen expiration date [id number]     TYPE & CROSSMATCH    Collection Time: 02/05/15  8:30 AM   Result Value Ref Range    Product ID Red Blood Cells      Unit number [id number]      Crossmatch result Compatible      Status info. Transfused      Product code [id number]      Blood type code 8400      Issue date/time 027253664403      Specimen expiration date [id number]     BLOOD TYPE CONFIRM.    Collection Time: 02/05/15  8:49 AM   Result Value Ref Range    ABO/Rh(D) AB Rh Positive     CBC WITH AUTOMATED DIFF    Collection Time: 02/06/15  3:15 AM   Result Value Ref Range    WBC 25.1 (H) 4.0 - 11.0 1000/mm3    RBC 2.51 (L) 3.60 - 5.20 M/uL    HGB 8.4 (L) 13.0 - 17.2 gm/dl    HCT 64.3 (L) 32.9 - 50.0 %    MCV 113.5 (H) 80.0 - 98.0 fL    MCH 33.5 25.4 - 34.6 pg    MCHC 29.5 (L) 30.0 - 36.0 gm/dl    PLATELET 518 841 - 660 1000/mm3    MPV 11.6 (H) 6.0 - 10.0 fL    RDW-SD 87.9 (HH) 36.4 - 46.3      NRBC 0 0 - 0      NEUTROPHILS 82.7 (H) 34 - 64 %    LYMPHOCYTES 1.9 (L) 28 - 48 %    MONOCYTES 1.9 1 - 13 %    EOSINOPHILS 1.9 0 - 5 %     BASOPHILS 1.0 0 - 3 %    BANDS 4.8 0 - 11 %    METAMYELOCYTES 1.0 (H) 0 - 0 %    MYELOCYTES 4.8 (H) 0 - 0 %    Poikilocytosis 1+      ANISOCYTOSIS 2+      HYPOCHROMASIA 1+      POLYCHROMASIA 1+      Macrocytes 2+      Codocytes 1+      Dacrocytes 1+      Elliptocytes 1+      Basophilic stippling 1+      PLATELET COMMENTS NORMAL      Stomatocytes 1+     METABOLIC PANEL, BASIC    Collection Time: 02/06/15  3:15 AM   Result Value Ref Range    Sodium 143 136 - 145 mEq/L    Potassium 3.7 3.5 - 5.1 mEq/L    Chloride 111 (H) 98 - 107 mEq/L    CO2 25 21 - 32 mEq/L    Glucose 114 (H) 74 - 106 mg/dl    BUN 8 7 - 25 mg/dl    Creatinine 0.7 0.6 - 1.3 mg/dl    GFR est AA >63.0      GFR est non-AA >60      Calcium 7.9 (L) 8.5 - 10.1 mg/dl   MAGNESIUM    Collection Time: 02/06/15  3:15 AM   Result Value Ref Range    Magnesium 2.0 1.8 - 2.4 mg/dl         Medications:  Current Facility-Administered Medications   Medication Dose Route Frequency   ??? furosemide (LASIX) injection 40 mg  40 mg IntraVENous BID   ??? ciprofloxacin (  CIPRO) 400 mg IVPB (premix)  400 mg IntraVENous Q12H   ??? metroNIDAZOLE (FLAGYL) IVPB premix 500 mg  500 mg IntraVENous Q8H   ??? 0.9% sodium chloride infusion 250 mL  250 mL IntraVENous PRN   ??? acetaminophen (TYLENOL) tablet 650 mg  650 mg Oral Q6H PRN   ??? ondansetron (ZOFRAN) injection 4 mg  4 mg IntraVENous Q4H PRN   ??? albuterol CONCENTRATE 2.5mg /0.5 mL neb soln  2.5 mg Nebulization Q4H PRN   ??? ipratropium (ATROVENT) 0.02 % nebulizer solution 0.5 mg  0.5 mg Nebulization Q6H PRN   ??? HYDROmorphone (PF) (DILAUDID) injection 1 mg  1 mg IntraVENous Q3H PRN   ??? pantoprazole (PROTONIX) 40 mg in sodium chloride 0.9 % 10 mL injection  40 mg IntraVENous Q12H         Madaline Savage, MD  February 06, 2015  7:39 AM

## 2015-02-07 ENCOUNTER — Inpatient Hospital Stay: Admit: 2015-02-07 | Payer: MEDICARE | Primary: Student in an Organized Health Care Education/Training Program

## 2015-02-07 LAB — METABOLIC PANEL, BASIC
BUN: 7 mg/dl (ref 7–25)
CO2: 26 mEq/L (ref 21–32)
Calcium: 8 mg/dl — ABNORMAL LOW (ref 8.5–10.1)
Chloride: 106 mEq/L (ref 98–107)
Creatinine: 0.7 mg/dl (ref 0.6–1.3)
GFR est AA: >60
GFR est non-AA: >60
Glucose: 102 mg/dl (ref 74–106)
Potassium: 3.2 mEq/L — ABNORMAL LOW (ref 3.5–5.1)
Sodium: 142 mEq/L (ref 136–145)

## 2015-02-07 LAB — CBC WITH AUTOMATED DIFF
BAND NEUTROPHILS: 6.7 % (ref 0–11)
BASOPHILS: 6.7 % — ABNORMAL HIGH (ref 0–3)
HCT: 28.9 % — ABNORMAL LOW (ref 37.0–50.0)
HGB: 9 gm/dl — ABNORMAL LOW (ref 13.0–17.2)
LYMPHOCYTES: 1.9 % — ABNORMAL LOW (ref 28–48)
MCH: 34 pg (ref 25.4–34.6)
MCHC: 31.1 gm/dl (ref 30.0–36.0)
MCV: 109.1 fL — ABNORMAL HIGH (ref 80.0–98.0)
METAMYELOCYTES: 1 % — ABNORMAL HIGH (ref 0–0)
MONOCYTES: 1 % (ref 1–13)
MPV: 11.7 fL — ABNORMAL HIGH (ref 6.0–10.0)
MYELOCYTES: 6.7 % — ABNORMAL HIGH (ref 0–0)
NEUTROPHILS: 76.2 % — ABNORMAL HIGH (ref 34–64)
NRBC: 0 (ref 0–0)
PLATELET COMMENTS: NORMAL
PLATELET: 328 10*3/uL (ref 140–450)
RBC: 2.65 M/uL — ABNORMAL LOW (ref 3.60–5.20)
RDW-SD: 80.9 — CR (ref 36.4–46.3)
WBC: 24.9 10*3/uL — ABNORMAL HIGH (ref 4.0–11.0)

## 2015-02-07 LAB — METABOLIC PANEL, COMPREHENSIVE
ALT (SGPT): 10 U/L — ABNORMAL LOW (ref 12–78)
AST (SGOT): 12 U/L — ABNORMAL LOW (ref 15–37)
Albumin: 2.6 gm/dl — ABNORMAL LOW (ref 3.4–5.0)
Alk. phosphatase: 54 U/L (ref 45–117)
BUN: 8 mg/dl (ref 7–25)
Bilirubin, total: 0.6 mg/dl (ref 0.2–1.0)
CO2: 25 mEq/L (ref 21–32)
Calcium: 8.1 mg/dl — ABNORMAL LOW (ref 8.5–10.1)
Chloride: 107 mEq/L (ref 98–107)
Creatinine: 0.7 mg/dl (ref 0.6–1.3)
GFR est AA: >60
GFR est non-AA: >60
Glucose: 102 mg/dl (ref 74–106)
Potassium: 3.4 mEq/L — ABNORMAL LOW (ref 3.5–5.1)
Protein, total: 5.7 gm/dl — ABNORMAL LOW (ref 6.4–8.2)
Sodium: 142 mEq/L (ref 136–145)

## 2015-02-07 LAB — MAGNESIUM: Magnesium: 1.6 mg/dl — ABNORMAL LOW (ref 1.8–2.4)

## 2015-02-07 MED ORDER — HYDROXYUREA 500 MG CAPSULE
500 mg | Freq: Every day | ORAL | Status: DC
Start: 2015-02-07 — End: 2015-02-10
  Administered 2015-02-07 – 2015-02-09 (×3): via ORAL

## 2015-02-07 MED ORDER — LIDOCAINE (PF) 10 MG/ML (1 %) IJ SOLN
101 mg/mL (1 %) | INTRAMUSCULAR | Status: DC
Start: 2015-02-07 — End: 2015-02-07
  Administered 2015-02-07 (×3): via INTRAVENOUS

## 2015-02-07 MED ORDER — FUROSEMIDE 40 MG TAB
40 mg | Freq: Every day | ORAL | Status: DC
Start: 2015-02-07 — End: 2015-02-10
  Administered 2015-02-07 – 2015-02-09 (×3): via ORAL

## 2015-02-07 MED ORDER — LIDOCAINE (PF) 10 MG/ML (1 %) IJ SOLN
10 mg/mL (1 %) | INTRAMUSCULAR | Status: AC
Start: 2015-02-07 — End: 2015-02-07
  Administered 2015-02-07 (×4): via INTRAVENOUS

## 2015-02-07 MED ORDER — MAGNESIUM SULFATE 50 % (4 MEQ/ML) INJECTION
4 mEq/mL (50 %) | Freq: Once | INTRAMUSCULAR | Status: AC
Start: 2015-02-07 — End: 2015-02-07
  Administered 2015-02-07: 13:00:00 via INTRAVENOUS

## 2015-02-07 MED FILL — POTASSIUM CHLORIDE 2 MEQ/ML IV SOLN: 2 mEq/mL | INTRAVENOUS | Qty: 5

## 2015-02-07 MED FILL — HYDROMORPHONE (PF) 1 MG/ML IJ SOLN: 1 mg/mL | INTRAMUSCULAR | Qty: 1

## 2015-02-07 MED FILL — PROTONIX 40 MG INTRAVENOUS SOLUTION: 40 mg | INTRAVENOUS | Qty: 40

## 2015-02-07 MED FILL — METRONIDAZOLE IN SODIUM CHLORIDE (ISO-OSM) 500 MG/100 ML IV PIGGY BACK: 500 mg/100 mL | INTRAVENOUS | Qty: 100

## 2015-02-07 MED FILL — ACETAMINOPHEN 325 MG TABLET: 325 mg | ORAL | Qty: 2

## 2015-02-07 MED FILL — CIPROFLOXACIN IN D5W 400 MG/200 ML IV PIGGY BACK: 400 mg/200 mL | INTRAVENOUS | Qty: 200

## 2015-02-07 MED FILL — FUROSEMIDE 40 MG TAB: 40 mg | ORAL | Qty: 2

## 2015-02-07 MED FILL — MAGNESIUM SULFATE 50 % (4 MEQ/ML) INJECTION: 4 mEq/mL (50 %) | INTRAMUSCULAR | Qty: 6

## 2015-02-07 MED FILL — HYDROXYUREA 500 MG CAPSULE: 500 mg | ORAL | Qty: 1

## 2015-02-07 NOTE — Other (Signed)
Bedside shift change report given to Steve, RN (oncoming nurse) by Lori, RN (offgoing nurse). Report included the following information SBAR, Kardex, MAR and Recent Results.

## 2015-02-07 NOTE — Progress Notes (Addendum)
Per Dr Estil DaftSkandaraj, pt may be ready for dc to SNF tomorrow or Monday.  Informed him that we are waiting on a PASRR from [place] that cannot be obtained over the weekend.  [facility]  [facility] declined due to no available bed.  Have attempted to update family, however they have not visited pt today.

## 2015-02-07 NOTE — Progress Notes (Addendum)
Hospitalist Progress Note    Patient: Joanne Ruiz               Sex: female          DOA: 02/02/2015       Date of Birth:  1940/11/30      Age:  74 y.o.        LOS:  LOS: 5 days     PCP: PROVIDER UNKNOWN   Assessment and Plan:   1.?? Colonic perforation.  -Appreciate Surgery recommendation. Cleared for disposition  -slowly advance diet from liquid to GI soft  -conservative Mx ,pain improved.  -OOB with assistance, PT.  -no fever, Baseline high WBC from chronic myeloproliferative disorder.  -Continue Empiric IV Abx. Switched to Oral by tomorrow.  No more loose stools, suspect due to Abx.    2.??Suspected?? Sigmoid diverticulitis.  -Continue Abx  -monitor Fever, WBC trend  -supportive care.    3.Chronic severe Pulmonary hypertension  -Echocardiogram showing severe PTH with RVSP  -outpt Pulmonology follow up.  -supplemental oxygen  -LASIX  iv DAILY, will switch to oral lasix today to her home dose.    4.Acute on chronic Anemia of chronic disease with Myeloproliferative disorder.  -drop in H/H with no active bleeding, improved after 2 unit PRBC.  -give one dose IV Iron given this admission.  -Hematology consult appreciated.  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    6.COPD  -Oxygen (son states Patient is non complaint)  -Neb PRN  -stable    7.Aortic stenosis  -with good cups mobility.    8.Hypokalemia, Hypomagnesemia  -replete  -labs in AM    Dispo- possible SNF in 1-2 days.  D/W son over the phone and updated. [phone].    Problem List:  Active Problems:    Perforated viscus (02/02/2015)      Myeloproliferative disorder (HCC) (02/05/2015)      Neutrophilic leukocytosis (02/05/2015)              Subjective: feels better.     Joanne Ruiz is a 74 y.o. year old female who is being seen for abdominal pain.    Objective: not in distress.      Vital Signs:  Visit Vitals   Item Reading   ??? BP 134/62 mmHg   ??? Pulse 80   ??? Temp 97.7 ??F (36.5 ??C)   ??? Resp 22    ??? Ht  (1.575 m)   ??? Wt 74.844 kg (165 lb)   ??? BMI 30.17 kg/m2   ??? SpO2 100%   ??? Breastfeeding No       Physical Exam:  General appearance: Obese,alert, cooperative, no distress  Head: Normocephalic, atraumatic  Lungs: clear to auscultation bilaterally  Heart: regular rate and rhythm, S1, S2 normal  Abdomen: soft, non-tender. Bowel sounds normal.    Extremities: no cyanosis or edema  Neurologic: CN Grossly normal, Extremity strength bilaterally 5/5    Intake and Output:  Last three shifts:  04/21 1901 - 04/23 0700  In: 1415 [P.O.:715; I.V.:700]  Out: 2000 [Urine:2000]    Lab Results:  Recent Results (from the past 24 hour(s))   MAGNESIUM    Collection Time: 02/07/15  4:21 AM   Result Value Ref Range    Magnesium 1.6 (L) 1.8 - 2.4 mg/dl   METABOLIC PANEL, BASIC    Collection Time: 02/07/15  4:21 AM   Result Value Ref Range    Sodium  142 136 - 145 mEq/L    Potassium 3.2 (L) 3.5 - 5.1 mEq/L    Chloride 106 98 - 107 mEq/L    CO2 26 21 - 32 mEq/L    Glucose 102 74 - 106 mg/dl    BUN 7 7 - 25 mg/dl    Creatinine 0.7 0.6 - 1.3 mg/dl    GFR est AA >16.1>60.0      GFR est non-AA >60      Calcium 8.0 (L) 8.5 - 10.1 mg/dl       Images:  No results found.     Medications:  Current Facility-Administered Medications   Medication Dose Route Frequency   ??? magnesium sulfate 3 g in 0.9% sodium chloride 100 mL IVPB  3 g IntraVENous ONCE   ??? potassium chloride 10 mEq, lidocaine (PF) (XYLOCAINE) 10 mg/mL (1 %) 3 mL in 0.9% sodium chloride 100 mL IVPB   IntraVENous Q1H   ??? ciprofloxacin (CIPRO) 400 mg IVPB (premix)  400 mg IntraVENous Q12H   ??? metroNIDAZOLE (FLAGYL) IVPB premix 500 mg  500 mg IntraVENous Q8H   ??? furosemide (LASIX) injection 40 mg  40 mg IntraVENous DAILY   ??? 0.9% sodium chloride infusion 250 mL  250 mL IntraVENous PRN   ??? acetaminophen (TYLENOL) tablet 650 mg  650 mg Oral Q6H PRN   ??? ondansetron (ZOFRAN) injection 4 mg  4 mg IntraVENous Q4H PRN   ??? albuterol CONCENTRATE 2.5mg /0.5 mL neb soln  2.5 mg Nebulization Q4H PRN    ??? ipratropium (ATROVENT) 0.02 % nebulizer solution 0.5 mg  0.5 mg Nebulization Q6H PRN   ??? HYDROmorphone (PF) (DILAUDID) injection 1 mg  1 mg IntraVENous Q3H PRN   ??? pantoprazole (PROTONIX) 40 mg in sodium chloride 0.9 % 10 mL injection  40 mg IntraVENous Q12H         Madaline SavageKulasegaram Mannix Kroeker, MD  February 07, 2015  8:00 AM

## 2015-02-08 LAB — CBC WITH AUTOMATED DIFF
ATYPICAL LYMPHS: 1 % — ABNORMAL HIGH (ref 0–0)
BAND NEUTROPHILS: 7.7 % (ref 0–11)
BASOPHILS: 4.8 % — ABNORMAL HIGH (ref 0–3)
EOSINOPHILS: 1 % (ref 0–5)
HCT: 32.2 % — ABNORMAL LOW (ref 37.0–50.0)
HGB: 9.5 gm/dl — ABNORMAL LOW (ref 13.0–17.2)
LYMPHOCYTES: 1 % — ABNORMAL LOW (ref 28–48)
MCH: 33.1 pg (ref 25.4–34.6)
MCHC: 29.5 gm/dl — ABNORMAL LOW (ref 30.0–36.0)
MCV: 112.2 fL — ABNORMAL HIGH (ref 80.0–98.0)
METAMYELOCYTES: 1 % — ABNORMAL HIGH (ref 0–0)
MONOCYTES: 1.9 % (ref 1–13)
MPV: 11.9 fL — ABNORMAL HIGH (ref 6.0–10.0)
MYELOCYTES: 1 % — ABNORMAL HIGH (ref 0–0)
NEUTROPHILS: 80.8 % — ABNORMAL HIGH (ref 34–64)
NRBC: 0 (ref 0–0)
PLATELET COMMENTS: INCREASED
PLATELET: 325 10*3/uL (ref 140–450)
RBC: 2.87 M/uL — ABNORMAL LOW (ref 3.60–5.20)
RDW-SD: 82.9 — CR (ref 36.4–46.3)
WBC: 27.2 10*3/uL — ABNORMAL HIGH (ref 4.0–11.0)

## 2015-02-08 LAB — METABOLIC PANEL, BASIC
BUN: 8 mg/dl (ref 7–25)
CO2: 28 mEq/L (ref 21–32)
Calcium: 8.2 mg/dl — ABNORMAL LOW (ref 8.5–10.1)
Chloride: 104 mEq/L (ref 98–107)
Creatinine: 0.8 mg/dl (ref 0.6–1.3)
GFR est AA: >60
GFR est non-AA: >60
Glucose: 109 mg/dl — ABNORMAL HIGH (ref 74–106)
Potassium: 3.1 mEq/L — ABNORMAL LOW (ref 3.5–5.1)
Sodium: 142 mEq/L (ref 136–145)

## 2015-02-08 LAB — C. DIFFICILE/EPI PCR: C. diff toxin by PCR: NEGATIVE

## 2015-02-08 LAB — MAGNESIUM: Magnesium: 2 mg/dl (ref 1.8–2.4)

## 2015-02-08 MED ORDER — SODIUM CHLORIDE 0.9 % IV
101 mg/mL (1 %) | INTRAVENOUS | Status: AC
Start: 2015-02-08 — End: 2015-02-08
  Administered 2015-02-08 (×4): via INTRAVENOUS

## 2015-02-08 MED ORDER — LOPERAMIDE 2 MG CAP
2 mg | ORAL | Status: DC | PRN
Start: 2015-02-08 — End: 2015-02-09
  Administered 2015-02-08 (×2): via ORAL

## 2015-02-08 MED ORDER — SODIUM CHLORIDE 0.9 % INJECTION
40 mg | Freq: Every day | INTRAMUSCULAR | Status: DC
Start: 2015-02-08 — End: 2015-02-09
  Administered 2015-02-08: 14:00:00 via INTRAVENOUS

## 2015-02-08 MED FILL — PROTONIX 40 MG INTRAVENOUS SOLUTION: 40 mg | INTRAVENOUS | Qty: 40

## 2015-02-08 MED FILL — POTASSIUM CHLORIDE 2 MEQ/ML IV SOLN: 2 mEq/mL | INTRAVENOUS | Qty: 5

## 2015-02-08 MED FILL — CIPROFLOXACIN IN D5W 400 MG/200 ML IV PIGGY BACK: 400 mg/200 mL | INTRAVENOUS | Qty: 200

## 2015-02-08 MED FILL — HYDROMORPHONE (PF) 1 MG/ML IJ SOLN: 1 mg/mL | INTRAMUSCULAR | Qty: 1

## 2015-02-08 MED FILL — FUROSEMIDE 40 MG TAB: 40 mg | ORAL | Qty: 2

## 2015-02-08 MED FILL — LOPERAMIDE 2 MG CAP: 2 mg | ORAL | Qty: 1

## 2015-02-08 MED FILL — METRONIDAZOLE IN SODIUM CHLORIDE (ISO-OSM) 500 MG/100 ML IV PIGGY BACK: 500 mg/100 mL | INTRAVENOUS | Qty: 100

## 2015-02-08 MED FILL — HYDROXYUREA 500 MG CAPSULE: 500 mg | ORAL | Qty: 1

## 2015-02-08 NOTE — Progress Notes (Signed)
Hospitalist Progress Note    Patient: Joanne Ruiz               Sex: female          DOA: 02/02/2015       Date of Birth:  Feb 25, 1941      Age:  74 y.o.        LOS:  LOS: 6 days     PCP: PROVIDER UNKNOWN   Assessment and Plan:   1.??Colonic perforation.  -Appreciate Surgery recommendation. Cleared for disposition.  -slowly advance diet from liquid diet.  -conservative Mx ,pain improved.  -continues to have loose BM multiple times again from yesterday, despite changing Abx, reordered C.diff, negative.  -OOB with assistance, PT.  -no fever, Baseline high WBC from chronic myeloproliferative disorder.  -will consider Surgery recommendation , if continues to have diarrhea.  -KUB showed no SBO/ definitive pneumo peritoneum.  -hold Cipro, continue Flagyl.    2.??Suspected?? Sigmoid diverticulitis.  -Continue Abx  -monitor Fever, WBC trend  -supportive care.    3.Chronic severe Pulmonary hypertension  -Echocardiogram showing severe PTH with RVSP 87mmHg  -outpt Pulmonology follow up.  -supplemental oxygen  -started on home dose PO lasix.    4.Acute on chronic Anemia of chronic disease with Myeloproliferative disorder.  -drop in H/H with no active bleeding, improved after 2 unit PRBC.  -give one dose IV Iron given this admission.  -Hematology consult appreciated.  -monitor H/H  -continue Hydroxyurea.    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    6.COPD  -Oxygen (son states Patient is non complaint)  -Neb PRN  -stable    7.Aortic stenosis  -with good cups mobility.    8.Hypokalemia, Hypomagnesemia  -replete K.  -Mag level normal    Possible rehab by tomorrow, monitor electrolytes and diarrhea.    Problem List:  Active Problems:    Perforated viscus (02/02/2015)      Myeloproliferative disorder (HCC) (02/05/2015)      Neutrophilic leukocytosis (02/05/2015)              Subjective: multiple diarrhea.     Joanne Ruiz is a 74 y.o. year old female who is being seen for abdominal pain, diarrhea.     Objective:not in distress.      Vital Signs:  Visit Vitals   Item Reading   ??? BP 132/71 mmHg   ??? Pulse 82   ??? Temp 98.5 ??F (36.9 ??C)   ??? Resp 22   ??? Ht 5\' 2"  (1.575 m)   ??? Wt 74.844 kg (165 lb)   ??? BMI 30.17 kg/m2   ??? SpO2 100%   ??? Breastfeeding No       Physical Exam:  General appearance: alert, cooperative, no distress.  Head: Normocephalic,atraumatic  Neck: supple, trachea midline  Lungs: clear to auscultation bilaterally  Heart: regular rate and rhythm, S1, S2 norma.  Abdomen: soft, non-tender. Bowel sounds normal. No masses,  no organomegally.   Extremities:  no cyanosis or edema  Skin: Skin color, texture, turgor normal.   Neurologic: CN Grossly normal, Extremity strength bilaterally 5/5    Intake and Output:  Last three shifts:  04/22 1901 - 04/24 0700  In: 2480 [P.O.:1480; I.V.:1000]  Out: 2305 [Urine:2300]    Lab Results:  Recent Results (from the past 24 hour(s))   METABOLIC PANEL, BASIC    Collection Time: 02/08/15  6:13 AM   Result Value Ref Range    Sodium 142 136 - 145 mEq/L  Potassium 3.1 (L) 3.5 - 5.1 mEq/L    Chloride 104 98 - 107 mEq/L    CO2 28 21 - 32 mEq/L    Glucose 109 (H) 74 - 106 mg/dl    BUN 8 7 - 25 mg/dl    Creatinine 0.8 0.6 - 1.3 mg/dl    GFR est AA >16.1      GFR est non-AA >60      Calcium 8.2 (L) 8.5 - 10.1 mg/dl   CBC WITH AUTOMATED DIFF    Collection Time: 02/08/15  6:13 AM   Result Value Ref Range    WBC 27.2 (H) 4.0 - 11.0 1000/mm3    RBC 2.87 (L) 3.60 - 5.20 M/uL    HGB 9.5 (L) 13.0 - 17.2 gm/dl    HCT 09.6 (L) 04.5 - 50.0 %    MCV 112.2 (H) 80.0 - 98.0 fL    MCH 33.1 25.4 - 34.6 pg    MCHC 29.5 (L) 30.0 - 36.0 gm/dl    PLATELET 409 811 - 914 1000/mm3    MPV 11.9 (H) 6.0 - 10.0 fL    RDW-SD 82.9 (HH) 36.4 - 46.3      NRBC 0 0 - 0      NEUTROPHILS 80.8 (H) 34 - 64 %    LYMPHOCYTES 1.0 (L) 28 - 48 %    ATYPICAL LYMPHS 1.0 (H) 0 - 0 %    MONOCYTES 1.9 1 - 13 %    EOSINOPHILS 1.0 0 - 5 %    BASOPHILS 4.8 (H) 0 - 3 %    BANDS 7.7 0 - 11 %    METAMYELOCYTES 1.0 (H) 0 - 0 %     MYELOCYTES 1.0 (H) 0 - 0 %    Poikilocytosis 1+      ANISOCYTOSIS 2+      HYPOCHROMASIA 1+      Macrocytes 2+      Dacrocytes OCCASIONAL      Elliptocytes 1+      Basophilic stippling OCCASIONAL      PLATELET COMMENTS INCREASED     MAGNESIUM    Collection Time: 02/08/15  6:13 AM   Result Value Ref Range    Magnesium 2.0 1.8 - 2.4 mg/dl       Images:  Xr Abd (kub)    02/07/2015   AP view abdomen  INDICATION: Lower abdominal pain.     02/07/2015   Findings/impression:  Evaluation is somewhat limited secondary to patient body habitus however, bowel gas pattern appears nonobstructive. No definite pneumoperitoneum.        Medications:  Current Facility-Administered Medications   Medication Dose Route Frequency   ??? [START ON 02/09/2015] pantoprazole (PROTONIX) 40 mg in sodium chloride 0.9 % 10 mL injection  40 mg IntraVENous DAILY   ??? furosemide (LASIX) tablet 80 mg  80 mg Oral DAILY   ??? hydroxyurea (HYDREA) chemo cap 500 mg  500 mg Oral DAILY   ??? ciprofloxacin (CIPRO) 400 mg IVPB (premix)  400 mg IntraVENous Q12H   ??? metroNIDAZOLE (FLAGYL) IVPB premix 500 mg  500 mg IntraVENous Q8H   ??? 0.9% sodium chloride infusion 250 mL  250 mL IntraVENous PRN   ??? acetaminophen (TYLENOL) tablet 650 mg  650 mg Oral Q6H PRN   ??? ondansetron (ZOFRAN) injection 4 mg  4 mg IntraVENous Q4H PRN   ??? albuterol CONCENTRATE 2.5mg /0.5 mL neb soln  2.5 mg Nebulization Q4H PRN   ??? ipratropium (ATROVENT) 0.02 % nebulizer solution 0.5  mg  0.5 mg Nebulization Q6H PRN   ??? HYDROmorphone (PF) (DILAUDID) injection 1 mg  1 mg IntraVENous Q3H PRN         Madaline Savage, MD  February 08, 2015  8:50 AM

## 2015-02-08 NOTE — Other (Signed)
Verbal shift change report given to Steve, RN (oncoming nurse) by Nora, RN (offgoing nurse). Report included the following information SBAR, Kardex, MAR and Recent Results.

## 2015-02-08 NOTE — Other (Signed)
Bedside and Verbal shift change report given to rose z (oncoming nurse) by steve v (offgoing nurse). Report included the following information SBAR, Kardex and MAR.

## 2015-02-09 LAB — METABOLIC PANEL, COMPREHENSIVE
ALT (SGPT): 6 U/L — ABNORMAL LOW (ref 12–78)
AST (SGOT): 9 U/L — ABNORMAL LOW (ref 15–37)
Albumin: 2.4 gm/dl — ABNORMAL LOW (ref 3.4–5.0)
Alk. phosphatase: 54 U/L (ref 45–117)
BUN: 8 mg/dl (ref 7–25)
Bilirubin, total: 0.5 mg/dl (ref 0.2–1.0)
CO2: 28 mEq/L (ref 21–32)
Calcium: 7.7 mg/dl — ABNORMAL LOW (ref 8.5–10.1)
Chloride: 102 mEq/L (ref 98–107)
Creatinine: 0.8 mg/dl (ref 0.6–1.3)
GFR est AA: >60
GFR est non-AA: >60
Glucose: 106 mg/dl (ref 74–106)
Potassium: 3 mEq/L — ABNORMAL LOW (ref 3.5–5.1)
Protein, total: 5.3 gm/dl — ABNORMAL LOW (ref 6.4–8.2)
Sodium: 139 mEq/L (ref 136–145)

## 2015-02-09 LAB — CBC WITH AUTOMATED DIFF
BAND NEUTROPHILS: 2.9 % (ref 0–11)
BASOPHILS: 2.9 % (ref 0–3)
EOSINOPHILS: 1 % (ref 0–5)
HCT: 30.3 % — ABNORMAL LOW (ref 37.0–50.0)
HGB: 8.9 gm/dl — ABNORMAL LOW (ref 13.0–17.2)
LYMPHOCYTES: 0 % — ABNORMAL LOW (ref 28–48)
MCH: 33 pg (ref 25.4–34.6)
MCHC: 29.4 gm/dl — ABNORMAL LOW (ref 30.0–36.0)
MCV: 112.2 fL — ABNORMAL HIGH (ref 80.0–98.0)
METAMYELOCYTES: 1 % — ABNORMAL HIGH (ref 0–0)
MONOCYTES: 2.9 % (ref 1–13)
MPV: 12 fL — ABNORMAL HIGH (ref 6.0–10.0)
MYELOCYTES: 3.8 % — ABNORMAL HIGH (ref 0–0)
NEUTROPHILS: 85.7 % — ABNORMAL HIGH (ref 34–64)
PLATELET COMMENTS: NORMAL
PLATELET: 324 10*3/uL (ref 140–450)
RBC: 2.7 M/uL — ABNORMAL LOW (ref 3.60–5.20)
RDW-SD: 82.9 — CR (ref 36.4–46.3)
WBC: 27.2 10*3/uL — ABNORMAL HIGH (ref 4.0–11.0)

## 2015-02-09 MED ORDER — POTASSIUM CHLORIDE SR 20 MEQ TAB, PARTICLES/CRYSTALS
20 mEq | Freq: Two times a day (BID) | ORAL | Status: DC
Start: 2015-02-09 — End: 2015-02-09

## 2015-02-09 MED ORDER — METRONIDAZOLE 250 MG TAB
250 mg | Freq: Three times a day (TID) | ORAL | Status: DC
Start: 2015-02-09 — End: 2015-02-10
  Administered 2015-02-09 – 2015-02-10 (×4): via ORAL

## 2015-02-09 MED ORDER — DICYCLOMINE 10 MG CAP
10 mg | Freq: Three times a day (TID) | ORAL | Status: DC | PRN
Start: 2015-02-09 — End: 2015-02-10

## 2015-02-09 MED ORDER — POTASSIUM CHLORIDE SR 20 MEQ TAB, PARTICLES/CRYSTALS
20 mEq | Freq: Three times a day (TID) | ORAL | Status: DC
Start: 2015-02-09 — End: 2015-02-10
  Administered 2015-02-09 – 2015-02-10 (×4): via ORAL

## 2015-02-09 MED ORDER — CIPROFLOXACIN 500 MG TAB
500 mg | Freq: Two times a day (BID) | ORAL | Status: DC
Start: 2015-02-09 — End: 2015-02-10
  Administered 2015-02-09 – 2015-02-10 (×2): via ORAL

## 2015-02-09 MED ORDER — PANTOPRAZOLE 40 MG TAB, DELAYED RELEASE
40 mg | Freq: Every day | ORAL | Status: DC
Start: 2015-02-09 — End: 2015-02-10
  Administered 2015-02-09 – 2015-02-10 (×2): via ORAL

## 2015-02-09 MED FILL — METRONIDAZOLE IN SODIUM CHLORIDE (ISO-OSM) 500 MG/100 ML IV PIGGY BACK: 500 mg/100 mL | INTRAVENOUS | Qty: 100

## 2015-02-09 MED FILL — METRONIDAZOLE 250 MG TAB: 250 mg | ORAL | Qty: 2

## 2015-02-09 MED FILL — FUROSEMIDE 40 MG TAB: 40 mg | ORAL | Qty: 2

## 2015-02-09 MED FILL — KLOR-CON M20 MEQ TABLET,EXTENDED RELEASE: 20 mEq | ORAL | Qty: 2

## 2015-02-09 MED FILL — CIPROFLOXACIN 500 MG TAB: 500 mg | ORAL | Qty: 1

## 2015-02-09 MED FILL — HYDROXYUREA 500 MG CAPSULE: 500 mg | ORAL | Qty: 1

## 2015-02-09 MED FILL — PANTOPRAZOLE 40 MG TAB, DELAYED RELEASE: 40 mg | ORAL | Qty: 1

## 2015-02-09 NOTE — Progress Notes (Signed)
Hospitalist Progress Note    Patient: Joanne Ruiz               Sex: female          DOA: 02/02/2015       Date of Birth:  1941-01-22      Age:  74 y.o.        LOS:  LOS: 7 days     PCP: PROVIDER UNKNOWN   Assessment and Plan:   1.??Colonic perforation.  -D/W surgery today and appreciate  recommendation.   -Patient is receiving  ATC Dilaudid for pain control, though Pt states the  Pain is  improved,  this may be masking her pain  - D/Ced Dilaudid and monitor improvement in pain as  -advance diet from liquid diet to GI soft diet.  - loose BM multiple times resolved after Imodium , C.diff negative  -continue Po Cipro, flagyl.   -OOB with assistance, PT.  -no fever, Baseline high WBC from chronic myeloproliferative disorder.    2.??Suspected?? Sigmoid diverticulitis.  -Continue Abx added Cipro, continue Flagyl.  -monitor Fever, WBC trend  -supportive care.    3.Chronic severe Pulmonary hypertension  -Echocardiogram showing severe PTH with RVSP 56mHg  -outpt Pulmonology follow up.  -saturations are better in Room Air.  -started on home dose PO lasix.    4.Acute on chronic Anemia of chronic disease with Myeloproliferative disorder.  -drop in H/H with no active bleeding, improved after 2 unit PRBC.  -give one dose IV Iron given this admission.  -Hematology consult appreciated.  -monitor H/H, slow declined noticed again.  -continue Hydroxyurea.    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    6.COPD  -Oxygen (son states Patient is non complaint) qhs  -Neb PRN  -stable    7.Aortic stenosis  -with good cups mobility.    8.Hypokalemia, Hypomagnesemia  -replete K.  -Mag level normal    Monitor abdominal discomfort off IV pain meds, if doing OK with advancing diet would discharge in AM  dispo-patient and son wants to get home with home PT, tomorrow.  Problem List:  Active Problems:    Perforated viscus (02/02/2015)      [place] (43/10/6008      Neutrophilic leukocytosis (49/32/3557               Subjective: no diarrhea, no BM, abdominal pain is better.     Joanne Ruiz a 74y.o. year old female who is being seen for abdominal pain.    Objective: not in distress.      Vital Signs:  Visit Vitals   Item Reading   ??? BP 114/58 mmHg   ??? Pulse 81   ??? Temp 97.5 ??F (36.4 ??C)   ??? Resp 19   ??? Ht '5\' 2"'  (1.575 m)   ??? Wt 74.844 kg (165 lb)   ??? BMI 30.17 kg/m2   ??? SpO2 90%   ??? Breastfeeding No       Physical Exam:  General appearance: alert, cooperative, no distress.  Head: Normocephalic,atraumatic  Neck: supple, trachea midline  Lungs: clear to auscultation bilaterally  Heart: regular rate and rhythm, S1, S2 normal  Abdomen: soft, non-tender. Bowel sounds normal.    Extremities:no cyanosis or edema  Neurologic: CN Grossly normal, Extremity strength bilaterally 5/5    Intake and Output:  Last three shifts:  04/23 1901 - 04/25 0700  In: 2420 [P.O.:1920; I.V.:500]  Out: 2005 [Urine:2000]    Lab Results:  Recent Results (from the past 24 hour(s))   C. DIFFICILE/EPI PCR    Collection Time: 02/08/15  8:57 AM   Result Value Ref Range    C. diff toxin by PCR Toxigenic C. difficile NEGATIVE Toxigenic C. difficile NEGATIVE     CBC WITH AUTOMATED DIFF    Collection Time: 02/09/15  3:19 AM   Result Value Ref Range    WBC 27.2 (H) 4.0 - 11.0 1000/mm3    RBC 2.70 (L) 3.60 - 5.20 M/uL    HGB 8.9 (L) 13.0 - 17.2 gm/dl    HCT 30.3 (L) 37.0 - 50.0 %    MCV 112.2 (H) 80.0 - 98.0 fL    MCH 33.0 25.4 - 34.6 pg    MCHC 29.4 (L) 30.0 - 36.0 gm/dl    PLATELET 324 140 - 450 1000/mm3    MPV 12.0 (H) 6.0 - 10.0 fL    RDW-SD 82.9 (HH) 36.4 - 46.3      NEUTROPHILS 85.7 (H) 34 - 64 %    LYMPHOCYTES 0.0 (L) 28 - 48 %    MONOCYTES 2.9 1 - 13 %    EOSINOPHILS 1.0 0 - 5 %    BASOPHILS 2.9 0 - 3 %    BANDS 2.9 0 - 11 %    METAMYELOCYTES 1.0 (H) 0 - 0 %    MYELOCYTES 3.8 (H) 0 - 0 %    ANISOCYTOSIS 1+      Macrocytes 1+      PLATELET COMMENTS NORMAL      Giant platelets OCCASIONAL     METABOLIC PANEL, COMPREHENSIVE     Collection Time: 02/09/15  3:19 AM   Result Value Ref Range    Sodium 139 136 - 145 mEq/L    Potassium 3.0 (L) 3.5 - 5.1 mEq/L    Chloride 102 98 - 107 mEq/L    CO2 28 21 - 32 mEq/L    Glucose 106 74 - 106 mg/dl    BUN 8 7 - 25 mg/dl    Creatinine 0.8 0.6 - 1.3 mg/dl    GFR est AA >60.0      GFR est non-AA >60      Calcium 7.7 (L) 8.5 - 10.1 mg/dl    AST 9 (L) 15 - 37 U/L    ALT 6 (L) 12 - 78 U/L    Alk. phosphatase 54 45 - 117 U/L    Bilirubin, total 0.5 0.2 - 1.0 mg/dl    Protein, total 5.3 (L) 6.4 - 8.2 gm/dl    Albumin 2.4 (L) 3.4 - 5.0 gm/dl         Medications:  Current Facility-Administered Medications   Medication Dose Route Frequency   ??? pantoprazole (PROTONIX) tablet 40 mg  40 mg Oral ACB   ??? metroNIDAZOLE (FLAGYL) tablet 500 mg  500 mg Oral TID   ??? ciprofloxacin HCl (CIPRO) tablet 500 mg  500 mg Oral Q12H   ??? potassium chloride (K-DUR, KLOR-CON) SR tablet 40 mEq  40 mEq Oral BID   ??? dicyclomine (BENTYL) capsule 10 mg  10 mg Oral TID PRN   ??? furosemide (LASIX) tablet 80 mg  80 mg Oral DAILY   ??? hydroxyurea (HYDREA) chemo cap 500 mg  500 mg Oral DAILY   ??? 0.9% sodium chloride infusion 250 mL  250 mL IntraVENous PRN   ??? acetaminophen (TYLENOL) tablet 650 mg  650 mg Oral Q6H PRN   ??? ondansetron (ZOFRAN) injection 4 mg  4 mg  IntraVENous Q4H PRN   ??? albuterol CONCENTRATE 2.17m/0.5 mL neb soln  2.5 mg Nebulization Q4H PRN   ??? ipratropium (ATROVENT) 0.02 % nebulizer solution 0.5 mg  0.5 mg Nebulization Q6H PRN         [facility] MD  February 09, 2015  8:46 AM

## 2015-02-09 NOTE — Other (Signed)
Bedside and Verbal shift change report given to LATRISHA ROGERS, LPN (oncoming nurse) by Robin,RN (offgoing nurse). Report included the following information SBAR, Kardex, Intake/Output and Recent Results.

## 2015-02-09 NOTE — Other (Signed)
Bedside and Verbal shift change report given to Lennie Hummerobin G Rickards, RN   (oncoming nurse) by Okey Dupreose, RN (offgoing nurse). Report included the following information SBAR, Kardex and MAR.

## 2015-02-09 NOTE — Progress Notes (Signed)
physical Therapy Treatment    Patient: Joanne Ruiz ([age] y.o. female)  Room: 5211/5211    Date: 02/09/2015  Start Time:  1042  End Time:  1104    Primary Diagnosis: Perforated viscus          Precautions: Falls.  Weight bearing precautions: None    Orders reviewed, chart reviewed on Joanne Ruiz. Discussed with msg.     ASSESSMENT :  Based on the objective data described below, the patient presents with   improved gait, improved exercise tolerance   Progression toward goals:            Improving appropriately and progressing toward goals            Improving slowly and progressing toward goals            Not making progress toward goals and plan of care will be adjusted      Patient???s rehabilitation potential is considered to be Excellent     Recommendations:  Physical Therapy  Discharge Recommendations: Home with home health PT  Further Equipment Recommendations for Discharge: None        PLAN :  Planned Interventions:  Functional mobility training Gait Training Stair training Balance Training Therapeutic exercises Therapeutic activities Neuro muscular re-education AD training Patient/caregiver education    Frequency/Duration: Patient will be followed by physical therapy Continue PT per intial POC to address goals.            SUBJECTIVE:   Patient "I am ready to go home"    OBJECTIVE DATA SUMMARY:     Patient found: Bed, EOB and Family present.    Pain Assessment before PT session: 0/10  Pain Location:    Pain Assessment after PT session: 0/10  Pain Location:              Yes, patient had pain medications            No, Patient has not had pain medications            Nurse notified    COGNITIVE STATUS:     Mental Status: Oriented x3.  Communication: normal.  Follows commands: intact.  General Cognition: intact .        Functional Mobility and Balance Status:     Sit to Stand -  standby assist  Stand to Sit -  standby assist      Balance:   Static Sitting Balance -  good  Dynamic Sitting Balance -  fair+   Static Standing Balance -  fair  Dynamic Standing Balance -  fair-        Ambulation/Gait Training:  200 feet steady, decreased cadence and decreased step height/length Rolling walker, standby assist      Stair Training:     steps    Therapeutic Exercises:      Lower Extremities:  Seated, Standing, Long arc quad, Straight leg raise, Marching, Mini squats, BLE, 15 reps, 20 reps, Rest breaks and standing 4-way hip B, B heel raises      Balance Activities:     Activity Tolerance:   fair    Final Location:   bed, EOB, all needs close, agrees to call for assistance, nurse notified  and family present    COMMUNICATION/EDUCATION:   Education: Patient, Benefit of activity while hospitalized, Call for assistance, Out of bed 2-3 times/day, Staff assistance with mobility, Changes positions frequently, Sit out of bed for 45-60 minutes or as tolerated, Safety, Functional mobility, Demonstrates adequately,  Verbalized understanding, Teaching method, Verbal and Role of PT, role of PT, PT plan of care.    Barriers to Learning/Limitations: None    Please refer to care plan and patient education section for further details.    Thank you for this referral.  Darnelle Going, PT, DPT  Pager [phone]

## 2015-02-09 NOTE — Progress Notes (Signed)
[facility]    Face to Face Encounter    Patient???s Name: Joanne PartyLinda Ruiz    Date of Birth: [birth year]    Primary Diagnosis: Perforated viscus    Date of Face to Face:   02/09/2015                                  Face to Face Encounter findings are related to primary reason for home care:   yes.     1. I certify that the patient needs intermittent care as follows: physical therapy: strengthening, stretching/ROM, gait/stair training and pt/caregiver education    2. I certify that this patient is homebound, that is: 1) patient requires the use of a walker device, special transportation, or assistance of another to leave the home; or 2) patient's condition makes leaving the home medically contraindicated; and 3) patient has a normal inability to leave the home and leaving the home requires considerable and taxing effort.  Patient may leave the home for infrequent and short duration for medical reasons, and occasional absences for non-medical reasons. Homebound status is due to the following functional limitations: Patient with strength deficits limiting the performance of all ADL's without caregiver assistance or the use of an assistive device.  Patient with poor safety awareness and is at risk for falls without assistance of another person and the use of an assistive device.  Patient with poor ambulation endurance limiting their safe ability to ascend/descend the required number of steps to leave the home.    3. I certify that this patient is under my care and that I, or a nurse practitioner or physician???s assistant, or clinical nurse specialist, or certified nurse midwife, working with me, had a Face-to-Face Encounter that meets the physician Face-to-Face Encounter requirements.  The following are the clinical findings from the Face-to-Face encounter that support the need for skilled services and is a summary of the encounter:     See discharge summary      Claudia DesanctisLEANOR F KRUTA, RN  02/09/2015       THE FOLLOWING TO BE COMPLETED BY THE COMMUNITY PHYSICIAN:    I concur with the findings described above from the F2F encounter that this patient is homebound and in need of a skilled service.    Certifying Physician: _____________________________________      Printed Certifying Physician Name: _____________________________________    Date: _________________

## 2015-02-09 NOTE — Progress Notes (Signed)
[facility] Health and due to living in [place] they [facility]

## 2015-02-10 LAB — METABOLIC PANEL, BASIC
BUN: 14 mg/dl (ref 7–25)
CO2: 26 mEq/L (ref 21–32)
Calcium: 7.9 mg/dl — ABNORMAL LOW (ref 8.5–10.1)
Chloride: 103 mEq/L (ref 98–107)
Creatinine: 1.2 mg/dl (ref 0.6–1.3)
GFR est AA: 56
GFR est non-AA: 47
Glucose: 115 mg/dl — ABNORMAL HIGH (ref 74–106)
Potassium: 4.3 mEq/L (ref 3.5–5.1)
Sodium: 139 mEq/L (ref 136–145)

## 2015-02-10 MED ORDER — POTASSIUM CHLORIDE SR 20 MEQ TAB, PARTICLES/CRYSTALS
20 mEq | ORAL_TABLET | Freq: Every day | ORAL | Status: AC
Start: 2015-02-10 — End: 2015-02-15

## 2015-02-10 MED ORDER — HYDROCODONE-ACETAMINOPHEN 5 MG-325 MG TAB
5-325 mg | ORAL_TABLET | Freq: Four times a day (QID) | ORAL | Status: AC | PRN
Start: 2015-02-10 — End: ?

## 2015-02-10 MED ORDER — LOPERAMIDE 2 MG CAP
2 mg | ORAL | Status: DC | PRN
Start: 2015-02-10 — End: 2015-02-10

## 2015-02-10 MED ORDER — POTASSIUM CHLORIDE SR 20 MEQ TAB, PARTICLES/CRYSTALS
20 mEq | Freq: Every day | ORAL | Status: DC
Start: 2015-02-10 — End: 2015-02-10

## 2015-02-10 MED ORDER — CIPROFLOXACIN 500 MG TAB
500 mg | ORAL_TABLET | Freq: Two times a day (BID) | ORAL | Status: AC
Start: 2015-02-10 — End: 2015-02-13

## 2015-02-10 MED ORDER — METRONIDAZOLE 500 MG TAB
500 mg | ORAL_TABLET | Freq: Three times a day (TID) | ORAL | Status: AC
Start: 2015-02-10 — End: 2015-02-13

## 2015-02-10 MED ORDER — PANTOPRAZOLE 40 MG TAB, DELAYED RELEASE
40 mg | ORAL_TABLET | Freq: Every day | ORAL | Status: AC
Start: 2015-02-10 — End: 2015-02-24

## 2015-02-10 MED ORDER — HYDROCODONE-ACETAMINOPHEN 5 MG-325 MG TAB
5-325 mg | Freq: Four times a day (QID) | ORAL | Status: DC | PRN
Start: 2015-02-10 — End: 2015-02-10

## 2015-02-10 MED ORDER — LOPERAMIDE 2 MG CAP
2 mg | ORAL_CAPSULE | ORAL | Status: AC | PRN
Start: 2015-02-10 — End: ?

## 2015-02-10 MED FILL — HYDROXYUREA 500 MG CAPSULE: 500 mg | ORAL | Qty: 1

## 2015-02-10 MED FILL — CIPROFLOXACIN 500 MG TAB: 500 mg | ORAL | Qty: 1

## 2015-02-10 MED FILL — METRONIDAZOLE 250 MG TAB: 250 mg | ORAL | Qty: 2

## 2015-02-10 MED FILL — KLOR-CON M20 MEQ TABLET,EXTENDED RELEASE: 20 mEq | ORAL | Qty: 2

## 2015-02-10 MED FILL — FUROSEMIDE 40 MG TAB: 40 mg | ORAL | Qty: 2

## 2015-02-10 MED FILL — PANTOPRAZOLE 40 MG TAB, DELAYED RELEASE: 40 mg | ORAL | Qty: 1

## 2015-02-10 NOTE — Other (Signed)
Bedside and Verbal shift change report given to Robin G Rickards, RN   (oncoming nurse) by Latrisha, LPN (offgoing nurse). Report included the following information SBAR, Kardex and MAR.

## 2015-02-10 NOTE — Progress Notes (Signed)
Discharge Plan:[facility] [place] will be staying with son at his house [address] [place], [place] [id number] phone [phone] or [phone] families number  Home Health agency number is [phone] fax number [phone] address of home helatth is [address] [place], [place] [id number]  All documents have been faxed to them via [facility] including face to face, home health orders, PT consult, face sheet, and H&P     Anticipated Discharge Date:4.26.16    Anticipated Discharge Transportation:family driving patient home    The patient admitted to the hospital on 02/02/2015 with   Chief Complaint   Patient presents with   ??? Abdominal Pain   . Current   Patient Active Problem List   Diagnosis Code   ??? Perforated viscus R19.8   ??? Myeloproliferative disorder (HCC) D47.1   ??? Neutrophilic leukocytosis D72.9   .    Therapy Recommendations:    OT:na    UU:VOZDPT:Home Health     SLP:na    RT Home O2 Evaluation:na     Lines, Drains, and Airway:     IV Antibiotics:na    Anticipated Duration of Antibiotic Therapy:na    PEG Tube:na    Dietician Recommendation:regualr diet as tolerated    Dialysis Catheter:na    Dialysis Center, Days, and Chair Time:na    Anticipated Discharge Facility/Agency updated on progress:going to home health with sons address please see above for information on DC

## 2015-02-10 NOTE — Progress Notes (Signed)
DISCHARGE SUMMARY from Nurse    The following personal items are in your possession at time of discharge:    Dental Appliances: None  Visual Aid: Glasses     Home Medications: None  Jewelry: None     Other Valuables: At bedside, Eyeglasses  Personal Items Sent to Safe: none      PATIENT INSTRUCTIONS:    Notify you Physician if you experience:  ?? Increased Shortness of Breath  ?? Dizziness  ?? Fainting  ?? Black Stools  ?? Vomiting  ?? Coughing White, Frothy or Bloody Sputum  ?? Dry cough that will not go away  ?? Increased swelling of arms, legs, ankles or abdomen  ?? Develop a rash  ?? Difficulty breathing while laying down  ?? Urine problems: pain, burning, urgency or difficulty  ?? Temperature greater than 100 degrees F, shaking, chills for more than 24 hours  ?? Increased skin bruising  ?? Sore Mouth, throat or gums  ?? Increased cough, fatigue  ?? Clicking/popping sound in a joint  ?? Increased limb shortening or turning outward        Call you Doctor right away if you have new symptoms such as:  ?? Cough that is worse at night and when you are lying down  ?? Swelling in your legs, ankles, feet, abdomen and/or veins in the neck      Lifestyle Tips:  Appointment after discharge and follow up phone call:   After being discharged, if your appointment does not work for you, please feel free to contact the physician's office to change the appointment.   We want to make sure you are doing well after you have left the hospital.  You will receive a phone call on behalf of [facility] to follow up with you within 24-72 hours of your discharge from the hospital.  This phone call will help [place] provide you with the best possible care.  Please take the time to answer all questions you are asked so we can help you stay safe in your home before your next doctor's appointment.    Medications:   Take your medicines exactly as instructed.   Ask your doctor or nurse if you have questions about your medicines.    Tell your doctor right away if you have problems with your medicines.  Activity:   Ask your doctor how active you should be.  Remember to take rest breaks.   Do not cross your legs when sitting.   Elevate your legs when you can.  Diet:   Follow any special diet orders from your doctor.   Ask your doctor how much salt (sodium) you should have each day.   Ask if you need to limit the amount or types of fluids you drink each day.  Weight Monitoring:   If you are overweight, ask about a weight loss plan that is right for you.   Weight gain may mean that fluids are building up in your body.   Weigh yourself every day at about the same time and write it down.  Do Not Smoke:   If you smoke, quit.  Smoking is bad for your health.   Avoid second hand smoke.   Call the [place] QUITLINE for help at 684-758-2606 (1-800-Quit - Now)  Other Tips:   Avoid people with the flu or pneumonia   Keep all of your doctor appointments   Carry a list of your medications, allergies and the shots you have had  These are general instructions for a healthy lifestyle:    No smoking/ No tobacco products/ Avoid exposure to second hand smoke    Surgeon General's Warning:  Quitting smoking now greatly reduces serious risk to your health.    Obesity, smoking, and sedentary lifestyle greatly increases your risk for illness    A healthy diet, regular physical exercise & weight monitoring are important for maintaining a healthy lifestyle    You may be retaining fluid if you have a history of heart failure or if you experience any of the following symptoms:  Weight gain of 3 pounds or more overnight or 5 pounds in a week, increased swelling in our hands or feet or shortness of breath while lying flat in bed.  Please call your doctor as soon as you notice any of these symptoms; do not wait until your next office visit.    Recognize signs and symptoms of STROKE:    F-face looks uneven    A-arms unable to move or move unevenly     S-speech slurred or non-existent    T-time-call 911 as soon as signs and symptoms begin-DO NOT go       Back to bed or wait to see if you get better-TIME IS BRAIN.    Warning Signs of HEART ATTACK     Call 911 if you have these symptoms:  ? Chest discomfort. Most heart attacks involve discomfort in the center of the chest that lasts more than a few minutes, or that goes away and comes back. It can feel like uncomfortable pressure, squeezing, fullness, or pain.  ? Discomfort in other areas of the upper body. Symptoms can include pain or discomfort in one or both arms, the back, neck, jaw, or stomach.  ? Shortness of breath with or without chest discomfort.  ? Other signs may include breaking out in a cold sweat, nausea, or lightheadedness.  Don't wait more than five minutes to call 911 ??? MINUTES MATTER! Fast action can save your life. Calling 911 is almost always the fastest way to get lifesaving treatment. Emergency Medical Services staff can begin treatment when they arrive ??? up to an hour sooner than if someone gets to the hospital by car.     Myself and/or my family have received education about my diagnosis throughout my hospital stay.  During my hospital stay, I and/or my family/caregiver were included in planning my care upon discharge.  My needs were taken into consideration and I was included in my discharge planning.  I had an opportunity to ask questions.      The discharge information has been reviewed with the patient.  The patient verbalized understanding.    Discharge Caregiver Notified of Impending Discharge: Yes  Name of Designated Discharge Caregiver: Levander CampionRISPIN ELINGTON  Discharge Plan/Follow Up Care Reviewed: No       Discharge medications reviewed with the patient and appropriate educational materials and side effects teaching were provided.

## 2015-02-10 NOTE — Discharge Summary (Addendum)
Discharge Summary    Patient: Joanne Ruiz               Sex: female          DOA: 02/02/2015         Date of Birth:  Jun 05, 1941      Age:  74 y.o.        LOS:  LOS: 8 days                PCP: PROVIDER UNKNOWN    Admit Date: 02/02/2015    Discharge Date: 02/10/2015    Reason for admission: Perforated viscus    Initial Presentation:  CHIEF COMPLAINT:  Abdominal pain.  ??  HISTORY OF PRESENT ILLNESS:  This is a [age] year old female with past medical history significant for anemia,  ??thrombocytopenia, acid stomach, hypertension, pulmonary hypertension, history  ??of cervical cancer, spinal stenosis, hydronephrosis, osteoporosis, history of  ??detached retina, goiter, COPD oxygen dependent (2 liters at nighttime) and ??  aortic sclerosis, who presents to the emergency room with a 1-day duration of ??  abdominal pain.?? The patient woke up with the pain in a.m., presented ??  initially to [facility] where a CT scan of the abdomen and pelvis ??  was obtained, showing a pneumoperitoneum consistent with perforated viscus.?? ??  There was also colonic diverticulosis and minimal findings suggesting sigmoid ??  diverticulitis.?? A perforated sigmoid diverticulum is a possibility.?? No other  ??source of the pneumoperitoneum identified per radiologist read.?? The patient ??  subsequently had a surgical consultation there, and decision was made [facility] ??  [facility]?? Dr. Gabriel RainwaterJaklic from Colorectal ??  Surgery was consulted,???? recommends medical management at this time given ??  normal lactic acid and initial patient's abdominal exam.?? The patient also ??  complained of 1 episode of diarrhea.?? No fevers, no nausea or vomiting.?? No ??  chest pain, no shortness of breath.  Discharge Diagnoses:   Hospital Problems  Never Reviewed          Codes Class Noted POA    Myeloproliferative disorder (HCC) ICD-10-CM: D47.1  ICD-9-CM: 238.79  02/05/2015 Unknown        Neutrophilic leukocytosis ICD-10-CM: D72.9   ICD-9-CM: 288.8  02/05/2015 Unknown        Perforated viscus ICD-10-CM: R19.8  ICD-9-CM: 799.89  02/02/2015 Unknown              Discharge Medications:     Current Discharge Medication List      START taking these medications    Details   HYDROcodone-acetaminophen (NORCO) 5-325 mg per tablet Take 1 Tab by mouth every six (6) hours as needed. Max Daily Amount: 4 Tabs.  Qty: 15 Tab, Refills: 0      ciprofloxacin HCl (CIPRO) 500 mg tablet Take 1 Tab by mouth every twelve (12) hours for 3 days.  Qty: 6 Tab, Refills: 0      loperamide (IMODIUM) 2 mg capsule Take 1 Cap by mouth every four (4) hours as needed for Diarrhea.  Qty: 10 Cap, Refills: 0      metroNIDAZOLE (FLAGYL) 500 mg tablet Take 1 Tab by mouth three (3) times daily for 3 days.  Qty: 9 Tab, Refills: 0      pantoprazole (PROTONIX) 40 mg tablet Take 1 Tab by mouth Daily (before breakfast) for 14 days.  Qty: 14 Tab, Refills: 0      potassium chloride (K-DUR, KLOR-CON) 20 mEq tablet Take 1 Tab by mouth  daily for 5 days. Take when you take Lasix, check blood checked in 1 week.  Qty: 5 Tab, Refills: 0         CONTINUE these medications which have NOT CHANGED    Details   hydroxyurea (HYDREA) 500 mg capsule Take 500 mg by mouth daily.      furosemide (LASIX) 40 mg tablet Take 80 mg by mouth daily.             Follow-up: PCP in 1 week  Hematology in 1 week    Discharge Condition: Fair    Activity: PT/OT per Home Health    Diet: Cardiac Diet     Labs:  Labs: Results:       Chemistry Recent Labs      02/10/15   0326  02/09/15   0319  02/08/15   0613   GLU  115*  106  109*   NA  139  139  142   K  4.3  3.0*  3.1*   CL  103  102  104   CO2  BUN  CREA  1.2  0.8  0.8   CA  7.9*  7.7*  8.2*   AP   --   54   --    TP   --   5.3*   --    ALB   --   2.4*   --       CBC w/Diff Recent Labs      02/09/15   0319  02/08/15   0613   WBC  27.2*  27.2*   RBC  2.70*  2.87*   HGB  8.9*  9.5*   HCT  30.3*  32.2*   PLT  324  325   GRANS  85.7*  80.8*    LYMPH  0.0*  1.0*   EOS  1.0  1.0      Cardiac Enzymes No results for input(s): CPK, CKND1, MYO in the last 72 hours.    Invalid input(s): CKRMB, TROIP   Coagulation No results for input(s): PTP, INR, APTT in the last 72 hours.    Invalid input(s): INREXT    Lipid Panel No results found for: CHOL, CHOLPOCT, CHOLX, CHLST, CHOLV, [id number], HDL, LDL, NLDLCT, DLDL, LDLC, DLDLP, 161096, VLDLC, VLDL, TGL, TGLX, TRIGL, [id number], TRIGP, TGLPOCT, [id number], CHHD, CHHDX   BNP No results for input(s): BNPP in the last 72 hours.   Liver Enzymes Recent Labs      02/09/15   0319   TP  5.3*   ALB  2.4*   AP  54   SGOT  9*      Thyroid Studies No results found for: T4, T3U, TSH, TSHEXT           Consults: General Surgery and Hematology/Oncology    Treatment Team: Treatment Team: Attending Provider: Madaline Savage, MD; Consulting Provider: Irving Burton, MD; Hospitalist: Madaline Savage, MD; Consulting Provider: Jake Michaelis, MD; Care Manager: Claudia Desanctis, RN; Physical Therapist: Darnelle Going, PT      Hospital Course:   Major issues addressed during hospitalization outlined  below.     1.??Colonic perforation.  - surgery evaluated  and appreciate?? recommendation. ??  -Pain well controlled.  -monitored off IV dilaudid  -occasional loose stools, C.diff negative, can use Imodium PRN, d/w Surgery.  -advance diet  to GI soft diet, tolerating better.  -continue Po  Cipro, flagyl. ??  -OOB and  PT.  -no fever, Baseline high WBC from chronic myeloproliferative disorder.    2.??Suspected?? Sigmoid diverticulitis.  -Continue Abx Cipro,  Flagyl.  - no fever.  -supportive care.    3.Chronic severe Pulmonary hypertension  -Echocardiogram showing severe PTH with RVSP  -outpt Pulmonology follow up.  -saturations are better in Room Air.  -received IV lasix and started on home dose PO lasix.    4.Acute on chronic Anemia of chronic disease with Myeloproliferative disorder.   -drop in H/H with no active bleeding, improved after 2 unit PRBC.  -give one dose IV Iron given this admission.  -Hematology consult appreciated.  -monitored H/H  -no active bleeding  -continue Hydroxyurea.    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    6.COPD  -Oxygen (son states Patient is non complaint) qhs  -Neb PRN  -stable     7.Aortic stenosis  -with good cups mobility.    8.Hypokalemia, Hypomagnesemia  -repleted K.  -Mag level normal      Madaline Savage, MD  February 10, 2015        Total time spent 35 min

## 2016-02-25 NOTE — Telephone Encounter (Signed)
02/25/2016 Rcvd phone call from Thurston HoleAnne, at Dr. Marijean NiemannJaime fountain's office. They want to schedule an appt for the patient for Dx: Hydronephrosis w/ urethral stricture.  I am waiting on the faxed notes. When I received them, I'll have them scanned into the patient's chart.  Please help find a new patient appt for the patient. Thank you.

## 2016-03-22 ENCOUNTER — Encounter: Attending: Urology | Primary: Student in an Organized Health Care Education/Training Program

## 2016-12-06 ENCOUNTER — Inpatient Hospital Stay
Admission: AD | Admit: 2016-12-06 | Discharge: 2016-12-30 | Disposition: A | Discharge status: Home Health | Payer: Medicare Other | Source: Ambulatory Visit | Attending: Internal Medicine | Admitting: Internal Medicine

## 2016-12-06 DIAGNOSIS — K5730 Diverticulosis of large intestine without perforation or abscess without bleeding: Secondary | ICD-10-CM

## 2016-12-06 DIAGNOSIS — L0291 Cutaneous abscess, unspecified: Secondary | ICD-10-CM

## 2016-12-06 DIAGNOSIS — R011 Cardiac murmur, unspecified: Secondary | ICD-10-CM

## 2016-12-06 DIAGNOSIS — K5792 Diverticulitis of intestine, part unspecified, without perforation or abscess without bleeding: Secondary | ICD-10-CM

## 2016-12-06 DIAGNOSIS — N049 Nephrotic syndrome with unspecified morphologic changes: Secondary | ICD-10-CM

## 2016-12-06 DIAGNOSIS — D62 Acute posthemorrhagic anemia: Secondary | ICD-10-CM

## 2016-12-06 DIAGNOSIS — R001 Bradycardia, unspecified: Secondary | ICD-10-CM

## 2016-12-06 HISTORY — DX: Nontoxic goiter, unspecified: E04.9

## 2016-12-06 HISTORY — DX: Unspecified hydronephrosis: N13.30

## 2016-12-06 HISTORY — DX: Bradycardia, unspecified: R00.1

## 2016-12-06 HISTORY — DX: Right heart failure, unspecified: I50.810

## 2016-12-06 HISTORY — DX: Spinal stenosis, site unspecified: M48.00

## 2016-12-06 HISTORY — DX: Pulmonary hypertension, unspecified: I27.20

## 2016-12-06 HISTORY — DX: Other nonrheumatic aortic valve disorders: I35.8

## 2016-12-06 HISTORY — DX: Anemia, unspecified: D64.9

## 2016-12-06 HISTORY — DX: Tobacco use: Z72.0

## 2016-12-06 HISTORY — DX: Postural lordosis, lumbar region: M40.46

## 2016-12-06 HISTORY — DX: Essential (primary) hypertension: I10

## 2016-12-06 HISTORY — DX: Chronic obstructive pulmonary disease, unspecified: J44.9

## 2016-12-06 HISTORY — DX: Malignant neoplasm of cervix uteri, unspecified: C53.9

## 2016-12-06 HISTORY — DX: Hypomagnesemia: E83.42

## 2016-12-07 LAB — CBC
HCT: 28.8 % — ABNORMAL LOW (ref 36.0–46.0)
Hemoglobin: 8.4 g/dL — ABNORMAL LOW (ref 12.0–15.0)
MCH: 27.1 pg (ref 26.0–34.0)
MCHC: 29.2 g/dL — AB (ref 30.0–36.0)
MCV: 92.9 fL (ref 78.0–100.0)
PLATELETS: 463 10*3/uL — AB (ref 150–400)
RBC: 3.1 MIL/uL — ABNORMAL LOW (ref 3.87–5.11)
RDW: 18.8 % — AB (ref 11.5–15.5)
WBC: 10.5 10*3/uL (ref 4.0–10.5)

## 2016-12-07 LAB — BASIC METABOLIC PANEL
Anion gap: 9 (ref 5–15)
BUN: 11 mg/dL (ref 6–20)
CO2: 23 mmol/L (ref 22–32)
Calcium: 8.3 mg/dL — ABNORMAL LOW (ref 8.9–10.3)
Chloride: 109 mmol/L (ref 101–111)
Creatinine, Ser: 0.95 mg/dL (ref 0.44–1.00)
GFR calc Af Amer: >60 mL/min (ref >60)
GFR calc non Af Amer: 56 mL/min — ABNORMAL LOW (ref >60)
Glucose, Bld: 99 mg/dL (ref 65–99)
Potassium: 4.3 mmol/L (ref 3.5–5.1)
Sodium: 141 mmol/L (ref 135–145)

## 2016-12-08 ENCOUNTER — Encounter: Payer: Self-pay | Admitting: Physician Assistant

## 2016-12-08 DIAGNOSIS — I272 Pulmonary hypertension, unspecified: Secondary | ICD-10-CM

## 2016-12-08 DIAGNOSIS — K5792 Diverticulitis of intestine, part unspecified, without perforation or abscess without bleeding: Secondary | ICD-10-CM

## 2016-12-08 DIAGNOSIS — Z720 Tobacco use: Secondary | ICD-10-CM

## 2016-12-08 DIAGNOSIS — R001 Bradycardia, unspecified: Secondary | ICD-10-CM

## 2016-12-08 DIAGNOSIS — R011 Cardiac murmur, unspecified: Secondary | ICD-10-CM

## 2016-12-08 DIAGNOSIS — D62 Acute posthemorrhagic anemia: Secondary | ICD-10-CM

## 2016-12-08 LAB — TSH: TSH: 53.091 u[IU]/mL — AB (ref 0.350–4.500)

## 2016-12-08 NOTE — Consult Note (Signed)
Cardiology Consultation Note    Patient ID: Sara Black, MRN: [id number], DOB/AGE: 05-15-1941 76 y.o. Admit date: 12/06/2016   Date of Consult: 12/08/2016 Primary Physician: No primary care provider on file. Primary Cardiologist: New to [facility], being seen by Dr. Harrington Challenger  Chief Complaint: occasional abdominal discomfort, otherwise feeling well  Reason for Consultation: bradycardia Requesting MD: Mindi Slicker, PA-C   HPI: Sara Black is a 76 y.o. female with history of anemia, COPD, tobacco abuse since age 30, HTN, cervical cancer, spinal stenosis who tells me she has no history of heart issues other than bradycardia. She does report a history of seizures in the past due to beta blockers so this was discontinued. Pt reports drinking 1 glass of wine every other day. Has smoked since age 75   She was admitted 1/31-2/20 at [facility] with acute GI hemorrhage and severe anemia with Hgb 4-5 requiring transfusion. She was found to have diverticulitis with perforation and abdominal abscess requiring ex-lap with Hartmann's procedure on 2/7. She was initiated on hydroxyurea for thrombocytosis afterwards. Hospital course also notable for several additional issues outlined in DC summary -  1) Severe pulm HTN with plan per pulmonolgy for RHC once acute issues resolve.  2) Acute right heart failure with hypotension and shock felt due to pulm HTN, required IV vasopressin and flolan in the ICU. 2D echo 11/24/16: EF => 70%, hyperdynamic, systolic septal flattening c/w RV pressure overload, mild LVH, abnormal relaxation, severe LAE, moderate RAE, mild MR, mild-mod TR, RVSP 85-90, severe pulm HTN, mildly dilated IVC with decrease in inspiratory collapse. 3) AKI with right hydronephrosis with oliguria and Cr >3, up from 1.27 on 2/12. Required ureteral stent and percutaneous nephrostomy on 2/15. Abd CT at OSH showed question of splenomegaly and portal HTN in addition to the diverticulitis.   We are  consulted for bradycardia. She was admitted to [facility] 12/06/16. Per discussion with [facility] PA, on day of arrival the patient was sitting up talking to nursing when she brady'd down into the 20s-30s. She was reportedly completely asymptomatic and this resolved spontaneously. Since that time she has also had reported episodic drops into the 20s-30s while sleeping, again asymptomatic. She has since been switched to a more portable telemetry box for activity progression so I am told we do not have the data from those occurrences. There is a strip in her chart with a tracing where the computer read the HR as 34, but marching the interval out, the HR was around 50 at this time. The patient denies any h/o syncope, dizziness, or chest pain. No dyspnea. Blood pressures appear stable. Her biggest complaint is dealing with nephrostomy tube. She does snore.  Past Medical History:  Diagnosis Date  . Anemia   . Aortic valve sclerosis   . Bradycardia   . Cervical cancer ([place])   . COPD (chronic obstructive pulmonary disease) ([place])   . Exaggerated lumbar lordosis   . Goiter   . Hydronephrosis   . Hypertension   . Hypomagnesemia   . Pulmonary hypertension   . Pulmonary hypertension   . Right heart failure   . Spinal stenosis   . Tobacco abuse       Surgical History:  Past Surgical History:  Procedure Laterality Date  . COLOSTOMY    . Exploration of abdomen    . EYE SURGERY    . LAPAROTOMY    . ORIF ELBOW FRACTURE    . surgical resection of bowel    .  urethral stent       Home Meds: None on file  Inpatient Medications:  Ferrous Sulfate 324mg  daily Folic Acid 1mg  daily Gabapentin 100mg  qhs Heparin 5000 units q8hr Pantoprazole 40mg  daily TheraM Plus 1 tab daily PRNs including Mag sulfate, KCl, Tylenol, Hydrocodone/APAP, MAgOx, Morphine, Kayexalate   Allergies:  Allergies  Allergen Reactions  . Beta Adrenergic Blockers     Seizures     Social History   Social  History  . Marital status: Single    Spouse name: N/A  . Number of children: N/A  . Years of education: N/A   Occupational History  . Not on file.   Social History Main Topics  . Smoking status: Current Every Day Smoker  . Smokeless tobacco: Not on file     Comment: Since age 55  . Alcohol use Yes     Comment: 1 glass of wine every other night  . Drug use: No  . Sexual activity: Not on file   Other Topics Concern  . Not on file   Social History Narrative  . No narrative on file     Family History  Problem Relation Age of Onset  . Heart disease Mother     Details not clear  . Stroke Father      Review of Systems: All other systems reviewed and are otherwise negative except as noted above.  Labs: No results for input(s): CKTOTAL, CKMB, TROPONINI in the last 72 hours. Lab Results  Component Value Date   WBC 10.5 12/07/2016   HGB 8.4 (L) 12/07/2016   HCT 28.8 (L) 12/07/2016   MCV 92.9 12/07/2016   PLT 463 (H) 12/07/2016     Recent Labs Lab 12/07/16 0658  NA 141  K 4.3  CL 109  CO2 23  BUN 11  CREATININE 0.95  CALCIUM 8.3*  GLUCOSE 99   Radiology/Studies:  See above  EKG:  Initial EKG that was brought to me said STEMI due to lead wander in V4 which was only artifact F/u improved quality EKG showed NSR 64bpm, prior anterior artifact, QTc 48ms, PR 175ms, no acute ST-T changes  Physical Exam: Temp 98, HR 75, RR 20, BP 121/72 General: Well developed, well nourished pale WF in no acute distress. Head: Normocephalic, atraumatic, sclera non-icteric, no xanthomas, nares are without discharge.  Neck: Negative for carotid bruits. JVD not elevated. Lungs: Clear bilaterally to auscultation without wheezes, rales, or rhonchi. Breathing is unlabored. Heart: RRR with S1 S2. No murmurs, rubs, or gallops appreciated. Abdomen: Soft, non-tender, non-distended with normoactive bowel sounds. No hepatomegaly. No rebound/guarding. No obvious abdominal masses. Msk:   Strength and tone appear normal for age. Extremities: No clubbing or cyanosis. No edema.  Distal pedal pulses are 2+ and equal bilaterally. Neuro: Alert and oriented X 3. No facial asymmetry. No focal deficit. Moves all extremities spontaneously. Psych:  Responds to questions appropriately with a normal affect.     Assessment and Plan  73F with anemia, COPD, tobacco abuse since age 79, HTN, cervical cancer, spinal stenosis recently admitted with acute GI hemorrhage, ABL anemia (Hgb 4-5), perforated diverticulitis requiring abdominal surgery, with admission complicated by acute right heart failure and shock in the setting of severe pulmonary HTN and AKI with hydronephrosis requiring nephrostomy tubes. Cardiology asked to see for reports of bradycardia in the 20s-30s.  1. Bradycardia - no strips available from these episodes except for 1 located in chart that lists HR as 34 but marching boxes the HR was actually around  50. I have requested the staff to change her to the telemetry box that will store data for review so we can f/u telemetry in AM. Will check TSH. Wrote order to print out strips. See below for further thoughts per d/w Dr. Harrington Challenger.  2. Right heart failure - repeat echo here to reassess. Felt due to severe pulm HTN per notes. Etiology of severe pulm HTN not clear at this time, may be due to longstanding smoking/COPD. Cannot exclude OSA either with snoring and bradycardia. Will need OP sleep study and further workup. Would be careful about any volume depletion. Appears compensated at present time.  3. Longstanding tobacco - recommend cessation. Would benefit from longterm pulm f/u for optimization of COPD.  4. AKI - improved.  5. ABL anemia - Hgb in the 8 range by recent labs.  Signed, Charlie Pitter PA-C 12/08/2016, 5:21 PM Pager: [phone]   Patient seen and examined  I agree with findings as noted by D Dunn above   Pt is a 76 yo now at [facility]  We are asked to see about  bradycardic spells Pt denies dizziness  Does say that she had noticed Oxygen sats drop when she is sleeping   On exam:  JVP is normal  LUngs are rel clear  Cardiac exam RRR  Prominent P2  Gr II/VI systoli murmur LSB   Abd with colostomy  Ext with No edema  2+ pulses.  1  Bradycardia  I have not seen any significant episodes  HR can slow while sleeping but there have been no signif pauses.  I would recomm keeping track of O2 sat  If becomes hypoxic at night it will exacerbate bradycardia     2  Pulmonary HTN.  Severe.  Echo from [place] noted above  I would repeat here, esp if expected to be here fro any length of time   To confirm THis is more concerning overall  For now, would avoid meds that could acutely drop BP  She would not tolerate meds that drop preload  Keep oxygen over 90%   Etiol is not clear  She is not a candidate for anticoagulation   Long term would get followed in [place] area.  Dorris Carnes

## 2016-12-09 ENCOUNTER — Other Ambulatory Visit (HOSPITAL_BASED_OUTPATIENT_CLINIC_OR_DEPARTMENT_OTHER): Payer: Medicare Other

## 2016-12-09 DIAGNOSIS — R001 Bradycardia, unspecified: Secondary | ICD-10-CM

## 2016-12-09 LAB — BASIC METABOLIC PANEL
ANION GAP: 7 (ref 5–15)
BUN: 11 mg/dL (ref 6–20)
CO2: 25 mmol/L (ref 22–32)
Calcium: 8.2 mg/dL — ABNORMAL LOW (ref 8.9–10.3)
Chloride: 109 mmol/L (ref 101–111)
Creatinine, Ser: 1.06 mg/dL — ABNORMAL HIGH (ref 0.44–1.00)
GFR calc non Af Amer: 49 mL/min — ABNORMAL LOW (ref >60)
GFR, EST AFRICAN AMERICAN: 57 mL/min — AB (ref >60)
Glucose, Bld: 102 mg/dL — ABNORMAL HIGH (ref 65–99)
Potassium: 4.2 mmol/L (ref 3.5–5.1)
Sodium: 141 mmol/L (ref 135–145)

## 2016-12-09 LAB — CBC
HEMATOCRIT: 30 % — AB (ref 36.0–46.0)
HEMOGLOBIN: 8.7 g/dL — AB (ref 12.0–15.0)
MCH: 27 pg (ref 26.0–34.0)
MCHC: 29 g/dL — AB (ref 30.0–36.0)
MCV: 93.2 fL (ref 78.0–100.0)
Platelets: 464 10*3/uL — ABNORMAL HIGH (ref 150–400)
RBC: 3.22 MIL/uL — ABNORMAL LOW (ref 3.87–5.11)
RDW: 18.5 % — ABNORMAL HIGH (ref 11.5–15.5)
WBC: 13.4 10*3/uL — AB (ref 4.0–10.5)

## 2016-12-09 LAB — MAGNESIUM: Magnesium: 1.6 mg/dL — ABNORMAL LOW (ref 1.7–2.4)

## 2016-12-09 NOTE — Progress Notes (Signed)
  Echocardiogram 2D Echocardiogram has been performed.  Sara Black 12/09/2016, 12:09 PM

## 2016-12-10 LAB — T4, FREE: FREE T4: 0.47 ng/dL — AB (ref 0.61–1.12)

## 2016-12-10 LAB — MAGNESIUM: MAGNESIUM: 1.5 mg/dL — AB (ref 1.7–2.4)

## 2016-12-13 ENCOUNTER — Other Ambulatory Visit (HOSPITAL_COMMUNITY): Payer: Self-pay

## 2016-12-13 LAB — URINALYSIS, ROUTINE W REFLEX MICROSCOPIC
Bilirubin Urine: NEGATIVE
Glucose, UA: NEGATIVE mg/dL
Ketones, ur: NEGATIVE mg/dL
Nitrite: NEGATIVE
PROTEIN: 100 mg/dL — AB
Specific Gravity, Urine: 1.009 (ref 1.005–1.030)
pH: 7 (ref 5.0–8.0)

## 2016-12-13 LAB — BASIC METABOLIC PANEL
Anion gap: 4 — ABNORMAL LOW (ref 5–15)
BUN: 8 mg/dL (ref 6–20)
CALCIUM: 8.2 mg/dL — AB (ref 8.9–10.3)
CO2: 28 mmol/L (ref 22–32)
Chloride: 111 mmol/L (ref 101–111)
Creatinine, Ser: 0.98 mg/dL (ref 0.44–1.00)
GFR calc Af Amer: >60 mL/min (ref >60)
GFR, EST NON AFRICAN AMERICAN: 54 mL/min — AB (ref >60)
Glucose, Bld: 97 mg/dL (ref 65–99)
Potassium: 4.8 mmol/L (ref 3.5–5.1)
SODIUM: 143 mmol/L (ref 135–145)

## 2016-12-13 LAB — CBC WITH DIFFERENTIAL/PLATELET
BASOS ABS: 1 10*3/uL — AB (ref 0.0–0.1)
Basophils Relative: 6 %
EOS ABS: 0.3 10*3/uL (ref 0.0–0.7)
Eosinophils Relative: 2 %
HCT: 29.8 % — ABNORMAL LOW (ref 36.0–46.0)
Hemoglobin: 8.8 g/dL — ABNORMAL LOW (ref 12.0–15.0)
LYMPHS ABS: 0.5 10*3/uL — AB (ref 0.7–4.0)
LYMPHS PCT: 3 %
MCH: 28.3 pg (ref 26.0–34.0)
MCHC: 29.5 g/dL — ABNORMAL LOW (ref 30.0–36.0)
MCV: 95.8 fL (ref 78.0–100.0)
Monocytes Absolute: 0.5 10*3/uL (ref 0.1–1.0)
Monocytes Relative: 3 %
NEUTROS ABS: 14.7 10*3/uL — AB (ref 1.7–7.7)
Neutrophils Relative %: 86 %
PLATELETS: 498 10*3/uL — AB (ref 150–400)
RBC: 3.11 MIL/uL — AB (ref 3.87–5.11)
RDW: 19 % — ABNORMAL HIGH (ref 11.5–15.5)
WBC: 17 10*3/uL — AB (ref 4.0–10.5)

## 2016-12-13 LAB — MAGNESIUM: MAGNESIUM: 2.3 mg/dL (ref 1.7–2.4)

## 2016-12-13 LAB — PHOSPHORUS: Phosphorus: 3.8 mg/dL (ref 2.5–4.6)

## 2016-12-13 LAB — PATHOLOGIST SMEAR REVIEW

## 2016-12-15 LAB — URINALYSIS, ROUTINE W REFLEX MICROSCOPIC
Bilirubin Urine: NEGATIVE
Glucose, UA: NEGATIVE mg/dL
Ketones, ur: NEGATIVE mg/dL
Nitrite: POSITIVE — AB
PH: 6 (ref 5.0–8.0)
Protein, ur: 100 mg/dL — AB
SPECIFIC GRAVITY, URINE: 1.008 (ref 1.005–1.030)
Squamous Epithelial / LPF: NONE SEEN

## 2016-12-15 LAB — CBC WITH DIFFERENTIAL/PLATELET
BLASTS: 0 %
Band Neutrophils: 0 %
Basophils Absolute: 0.6 10*3/uL — ABNORMAL HIGH (ref 0.0–0.1)
Basophils Relative: 3 %
EOS ABS: 0 10*3/uL (ref 0.0–0.7)
EOS PCT: 0 %
HEMATOCRIT: 29.8 % — AB (ref 36.0–46.0)
HEMOGLOBIN: 8.6 g/dL — AB (ref 12.0–15.0)
LYMPHS ABS: 0.4 10*3/uL — AB (ref 0.7–4.0)
LYMPHS PCT: 2 %
MCH: 27.4 pg (ref 26.0–34.0)
MCHC: 28.9 g/dL — AB (ref 30.0–36.0)
MCV: 94.9 fL (ref 78.0–100.0)
MONOS PCT: 1 %
Metamyelocytes Relative: 0 %
Monocytes Absolute: 0.2 10*3/uL (ref 0.1–1.0)
Myelocytes: 0 %
NEUTROS ABS: 17.7 10*3/uL — AB (ref 1.7–7.7)
Neutrophils Relative %: 94 %
OTHER: 0 %
Platelets: 518 10*3/uL — ABNORMAL HIGH (ref 150–400)
Promyelocytes Absolute: 0 %
RBC: 3.14 MIL/uL — ABNORMAL LOW (ref 3.87–5.11)
RDW: 19 % — AB (ref 11.5–15.5)
WBC: 18.9 10*3/uL — ABNORMAL HIGH (ref 4.0–10.5)
nRBC: 0 /100 WBC

## 2016-12-15 LAB — RENAL FUNCTION PANEL
ANION GAP: 4 — AB (ref 5–15)
Albumin: 2.8 g/dL — ABNORMAL LOW (ref 3.5–5.0)
BUN: 12 mg/dL (ref 6–20)
CO2: 29 mmol/L (ref 22–32)
Calcium: 8.2 mg/dL — ABNORMAL LOW (ref 8.9–10.3)
Chloride: 108 mmol/L (ref 101–111)
Creatinine, Ser: 0.98 mg/dL (ref 0.44–1.00)
GFR calc Af Amer: >60 mL/min (ref >60)
GFR calc non Af Amer: 54 mL/min — ABNORMAL LOW (ref >60)
Glucose, Bld: 89 mg/dL (ref 65–99)
POTASSIUM: 4.7 mmol/L (ref 3.5–5.1)
Phosphorus: 3.7 mg/dL (ref 2.5–4.6)
SODIUM: 141 mmol/L (ref 135–145)

## 2016-12-15 LAB — MAGNESIUM: Magnesium: 1.7 mg/dL (ref 1.7–2.4)

## 2016-12-15 LAB — URINE CULTURE

## 2016-12-15 LAB — T4, FREE: Free T4: 0.45 ng/dL — ABNORMAL LOW (ref 0.61–1.12)

## 2016-12-15 LAB — TSH: TSH: 61.374 u[IU]/mL — ABNORMAL HIGH (ref 0.350–4.500)

## 2016-12-17 LAB — URINE CULTURE: Culture: >=100000 — AB

## 2016-12-19 ENCOUNTER — Other Ambulatory Visit (HOSPITAL_COMMUNITY): Payer: Self-pay

## 2016-12-19 LAB — CBC WITH DIFFERENTIAL/PLATELET
BASOS PCT: 5 %
Basophils Absolute: 1.4 10*3/uL — ABNORMAL HIGH (ref 0.0–0.1)
EOS PCT: 1 %
Eosinophils Absolute: 0.3 10*3/uL (ref 0.0–0.7)
HEMATOCRIT: 33.6 % — AB (ref 36.0–46.0)
HEMOGLOBIN: 9.5 g/dL — AB (ref 12.0–15.0)
LYMPHS ABS: 0.8 10*3/uL (ref 0.7–4.0)
Lymphocytes Relative: 3 %
MCH: 26.8 pg (ref 26.0–34.0)
MCHC: 28.3 g/dL — AB (ref 30.0–36.0)
MCV: 94.6 fL (ref 78.0–100.0)
MONO ABS: 0.6 10*3/uL (ref 0.1–1.0)
MONOS PCT: 2 %
NEUTROS ABS: 24.8 10*3/uL — AB (ref 1.7–7.7)
Neutrophils Relative %: 89 %
Platelets: 629 10*3/uL — ABNORMAL HIGH (ref 150–400)
RBC: 3.55 MIL/uL — AB (ref 3.87–5.11)
RDW: 18.6 % — AB (ref 11.5–15.5)
WBC: 27.9 10*3/uL — AB (ref 4.0–10.5)

## 2016-12-19 LAB — C DIFFICILE QUICK SCREEN W PCR REFLEX
C DIFFICILE (CDIFF) INTERP: NOT DETECTED
C DIFFICILE (CDIFF) TOXIN: NEGATIVE
C Diff antigen: NEGATIVE

## 2016-12-19 LAB — BASIC METABOLIC PANEL
Anion gap: 3 — ABNORMAL LOW (ref 5–15)
BUN: 10 mg/dL (ref 6–20)
CALCIUM: 8.4 mg/dL — AB (ref 8.9–10.3)
CHLORIDE: 107 mmol/L (ref 101–111)
CO2: 31 mmol/L (ref 22–32)
CREATININE: 0.94 mg/dL (ref 0.44–1.00)
GFR calc non Af Amer: 57 mL/min — ABNORMAL LOW (ref >60)
GLUCOSE: 115 mg/dL — AB (ref 65–99)
Potassium: 4.8 mmol/L (ref 3.5–5.1)
Sodium: 141 mmol/L (ref 135–145)

## 2016-12-19 MED ORDER — IOPAMIDOL (ISOVUE-300) INJECTION 61%
INTRAVENOUS | Status: AC
  Filled 2016-12-19: qty 30

## 2016-12-20 LAB — CBC WITH DIFFERENTIAL/PLATELET
Basophils Absolute: 1.2 10*3/uL — ABNORMAL HIGH (ref 0.0–0.1)
Basophils Relative: 6 %
EOS ABS: 0.2 10*3/uL (ref 0.0–0.7)
EOS PCT: 1 %
HEMATOCRIT: 29.4 % — AB (ref 36.0–46.0)
Hemoglobin: 8.4 g/dL — ABNORMAL LOW (ref 12.0–15.0)
Lymphocytes Relative: 2 %
Lymphs Abs: 0.4 10*3/uL — ABNORMAL LOW (ref 0.7–4.0)
MCH: 27.5 pg (ref 26.0–34.0)
MCHC: 28.6 g/dL — ABNORMAL LOW (ref 30.0–36.0)
MCV: 96.1 fL (ref 78.0–100.0)
MONO ABS: 0.6 10*3/uL (ref 0.1–1.0)
Monocytes Relative: 3 %
Neutro Abs: 18.2 10*3/uL — ABNORMAL HIGH (ref 1.7–7.7)
Neutrophils Relative %: 88 %
PLATELETS: 500 10*3/uL — AB (ref 150–400)
RBC: 3.06 MIL/uL — AB (ref 3.87–5.11)
RDW: 19.2 % — ABNORMAL HIGH (ref 11.5–15.5)
WBC: 20.6 10*3/uL — AB (ref 4.0–10.5)

## 2016-12-21 ENCOUNTER — Encounter (HOSPITAL_COMMUNITY): Payer: Self-pay | Admitting: Interventional Radiology

## 2016-12-21 ENCOUNTER — Other Ambulatory Visit (HOSPITAL_COMMUNITY): Payer: Self-pay

## 2016-12-21 HISTORY — PX: IR GENERIC HISTORICAL: IMG1180011

## 2016-12-21 MED ORDER — IOPAMIDOL (ISOVUE-300) INJECTION 61%
INTRAVENOUS | Status: AC
  Administered 2016-12-21: 20 mL
  Filled 2016-12-21: qty 50

## 2016-12-21 NOTE — Procedures (Signed)
Appropriately positioned and functioning right sided PCN and ureteral stent.   No exchange performed.  Ronny Bacon, MD Pager #: [phone]

## 2016-12-24 LAB — BASIC METABOLIC PANEL
ANION GAP: 10 (ref 5–15)
BUN: 10 mg/dL (ref 6–20)
CALCIUM: 8.4 mg/dL — AB (ref 8.9–10.3)
CHLORIDE: 108 mmol/L (ref 101–111)
CO2: 23 mmol/L (ref 22–32)
Creatinine, Ser: 0.78 mg/dL (ref 0.44–1.00)
GFR calc non Af Amer: >60 mL/min (ref >60)
Glucose, Bld: 95 mg/dL (ref 65–99)
Potassium: 4.4 mmol/L (ref 3.5–5.1)
Sodium: 141 mmol/L (ref 135–145)

## 2016-12-24 LAB — CULTURE, BLOOD (ROUTINE X 2)
CULTURE: NO GROWTH
Culture: NO GROWTH

## 2016-12-24 LAB — CBC
HCT: 30.4 % — ABNORMAL LOW (ref 36.0–46.0)
HEMOGLOBIN: 8.7 g/dL — AB (ref 12.0–15.0)
MCH: 27 pg (ref 26.0–34.0)
MCHC: 28.6 g/dL — ABNORMAL LOW (ref 30.0–36.0)
MCV: 94.4 fL (ref 78.0–100.0)
Platelets: 390 10*3/uL (ref 150–400)
RBC: 3.22 MIL/uL — AB (ref 3.87–5.11)
RDW: 19.1 % — ABNORMAL HIGH (ref 11.5–15.5)
WBC: 17.4 10*3/uL — ABNORMAL HIGH (ref 4.0–10.5)

## 2017-03-16 IMAGING — CT CT ABD-PELV W/O CM
2 of 4 series · 11 of 46 positions shown, 12 images · non-contrast
Comparison: 12/13/2016

CLINICAL DATA: Nausea since last night.  Diverticula of colon.

EXAM:
CT ABDOMEN AND PELVIS WITHOUT CONTRAST
TECHNIQUE: Multidetector CT imaging of the abdomen and pelvis was performed
following the standard protocol without IV contrast.

[Series 201: routine, idose (2) · axial · 0.78mm/px · z∈[+680,+1020]mm · 8 of 82 slices shown, 9 images]
[im 7/82  soft-tissue]
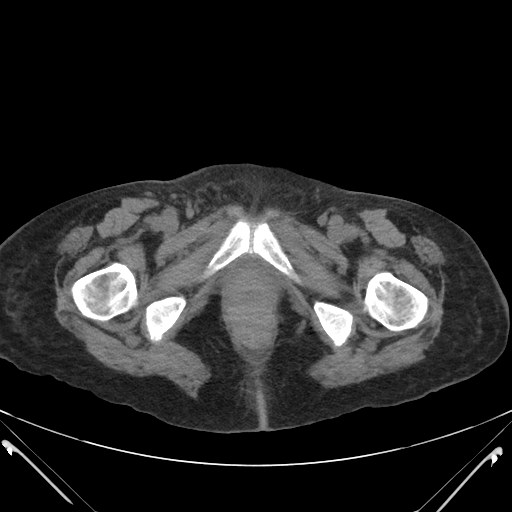
[im 7/82  bone]
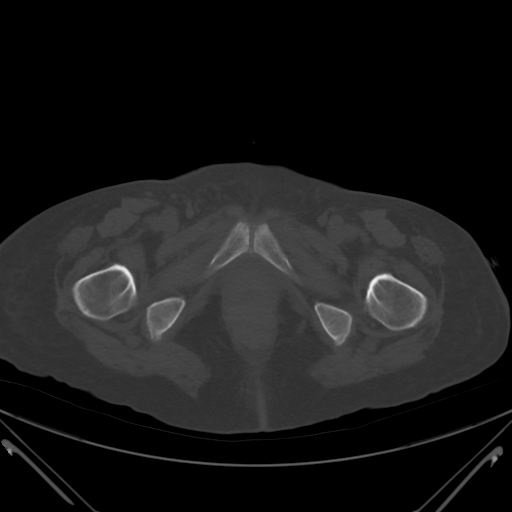
[im 16/82  soft-tissue]
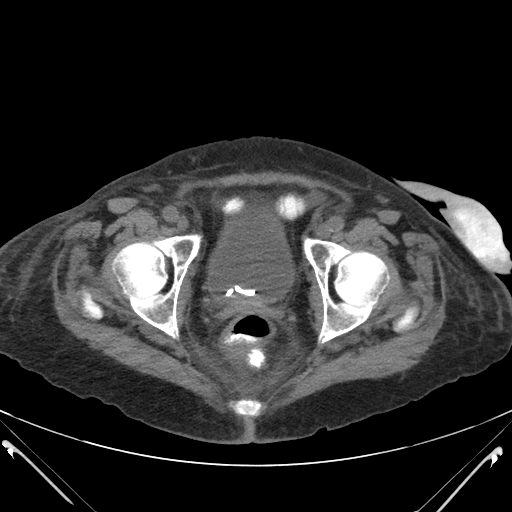
[im 25/82  soft-tissue]
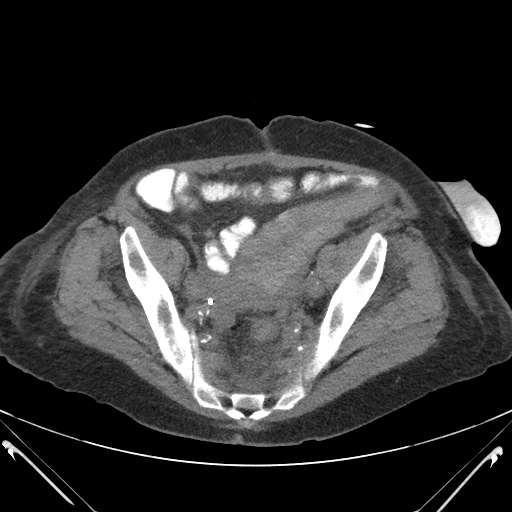
[im 35/82  soft-tissue]
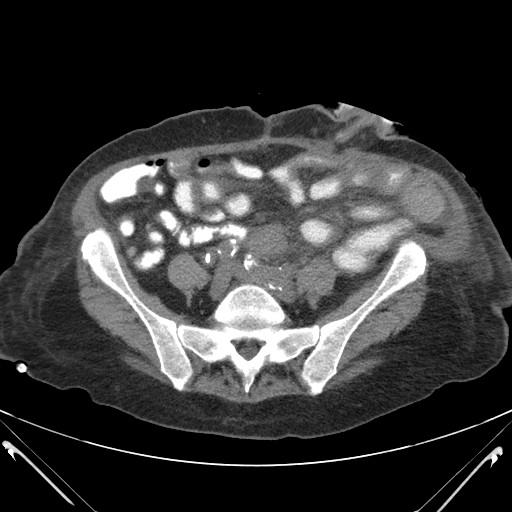
[im 47/82  soft-tissue]
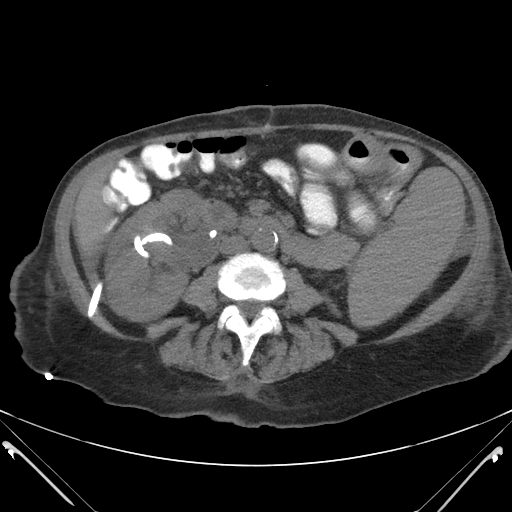
[im 57/82  soft-tissue]
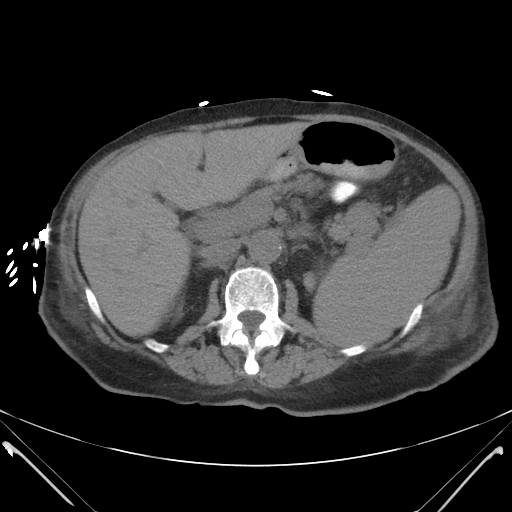
[im 66/82  soft-tissue]
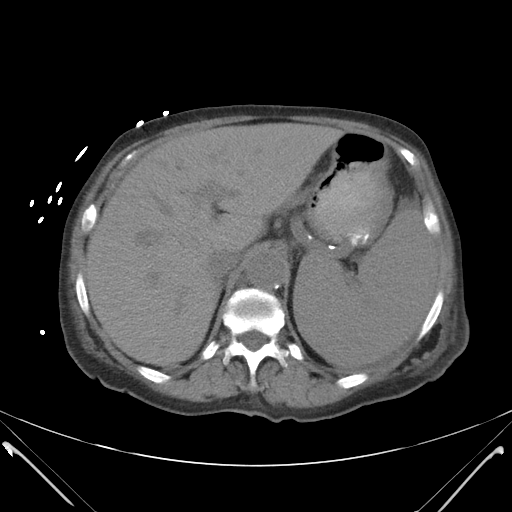
[im 75/82  soft-tissue]
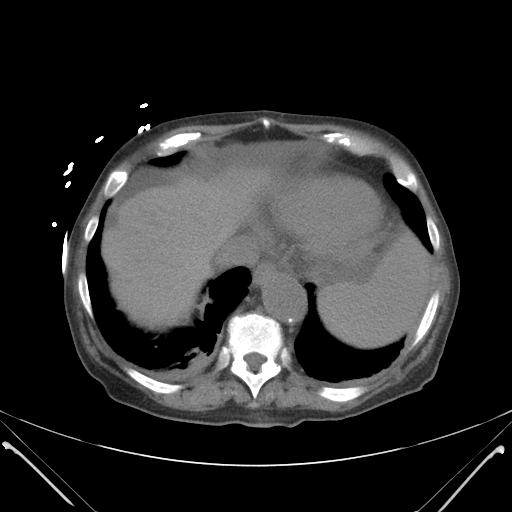

[Series 203: coronals, idose (2) · coronal · 0.45mm/px · 3 of 111 slices shown]
[im 37/111  soft-tissue]
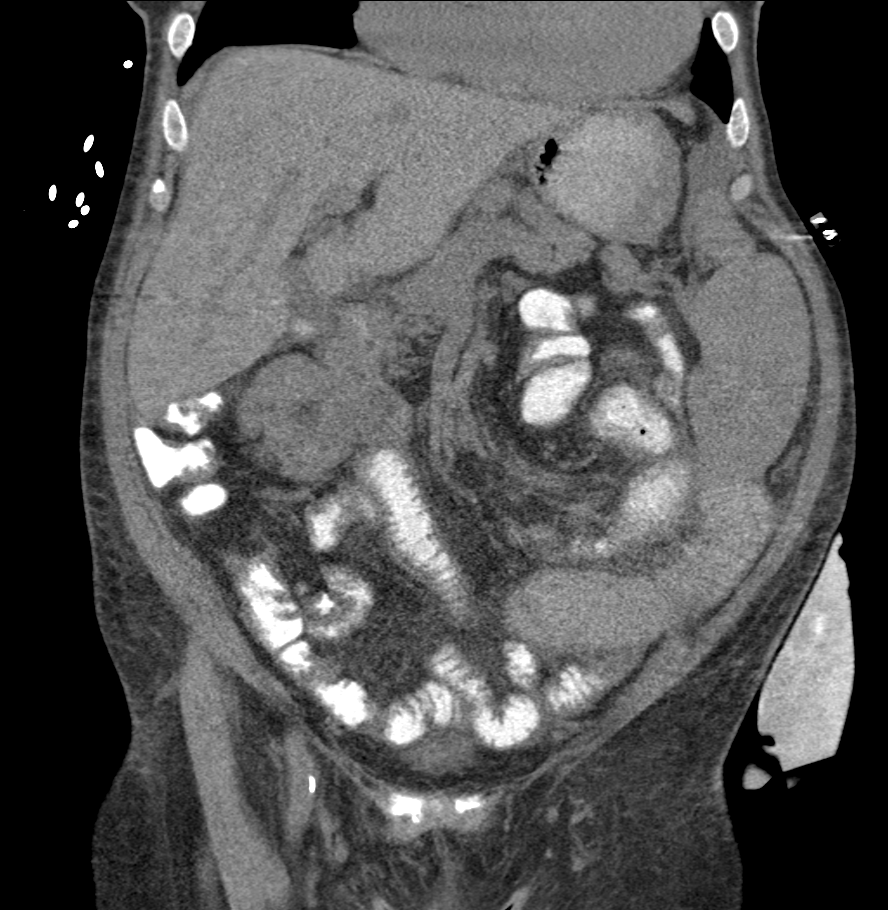
[im 49/111  soft-tissue]
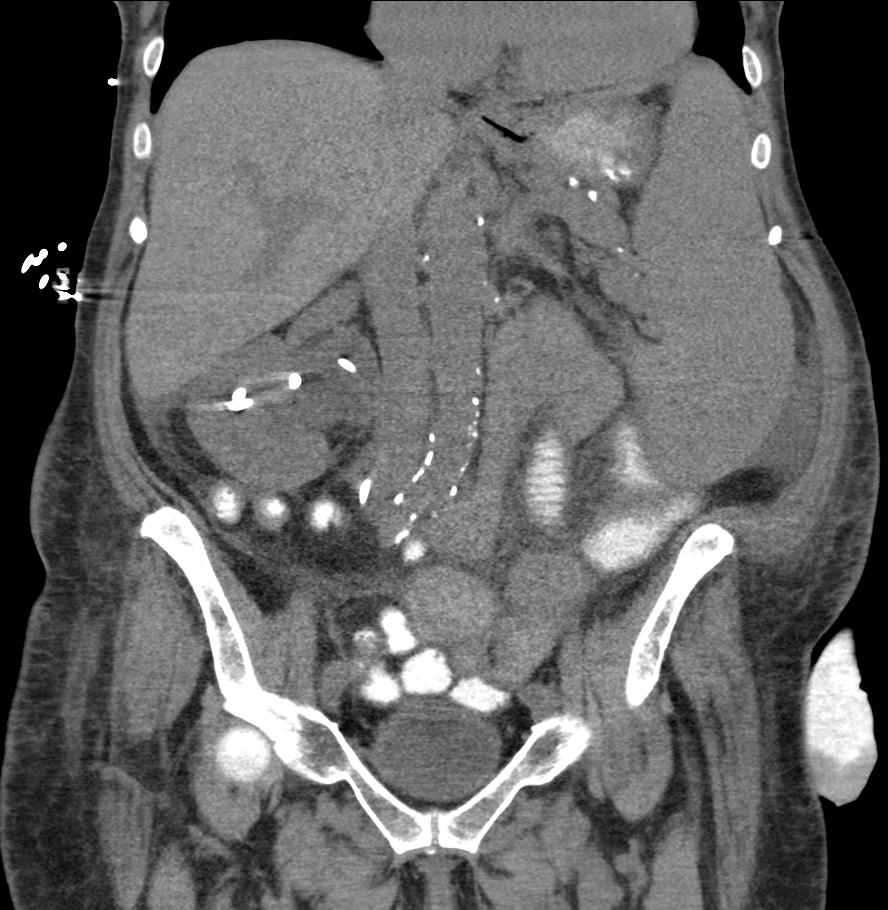
[im 62/111  soft-tissue]
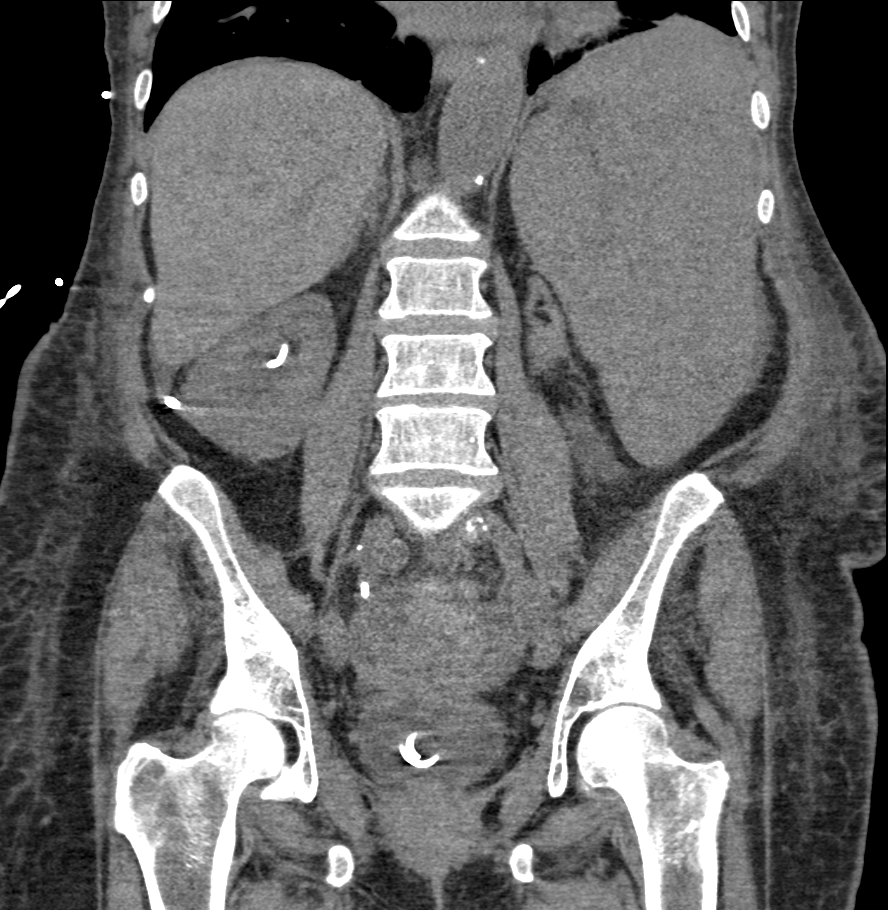

[11 of 46 positions shown; findings below may reference images not displayed]

FINDINGS: Lower chest: Stable enlargement of the heart. There is mild volume
loss at both lung bases.

Hepatobiliary: Small amount of perihepatic ascites. Contour of the
liver is within normal limits. The gallbladder appears to be a
decompressed with high-density stones. Subtle low-density along the
posterior right hepatic lobe on sequence 201, image 14 is similar to
the previous examination but nonspecific.

Pancreas: Limited evaluation of the pancreas on this noncontrast
examination but no significant change from the previous examination.

Spleen: Again noted is splenomegaly. Spleen measures a roughly
cm in the AP dimension. Small amount of perisplenic ascites.

Adrenals/Urinary Tract: Right adrenal is normal. Left adrenal gland
is not confidently identified. There is a small atrophic left
kidney. Again noted is a right percutaneous nephrostomy tube and a
right double-J ureteral stent. 1.4 cm stone in the right kidney
lower pole region. Despite the nephrostomy tube and ureter stent,
there is mild to moderate right hydronephrosis. Ureter stent appears
to be well positioned in the bladder. Small amount of fluid in the
urinary bladder.

Stomach/Bowel: There is an ostomy in the left abdomen. This appears
to represent a colostomy and the patient has a Hartmann's pouch.
Despite the Hartmann's pouch, there is high-density contrast in the
lower rectum which was present on the previous examination. There is
contrast at the ostomy and no evidence for a bowel obstruction.
However, there is a slightly enlarged bowel loop in the left lower
quadrant which measure up to 2.5 cm. In addition, there may be small
bowel wall thickening in this area. Small locules of gas in this
area are probably intraluminal but nonspecific and no evidence for
free air.

Vascular/Lymphatic: Atherosclerotic calcifications throughout the
aorta and iliac vessels. Limited evaluation for lymphadenopathy on
this noncontrast examination.

Reproductive: Uterus appears to be surgically absent but this area
is very poorly characterized. No gross adnexal abnormality.

Other: There is edema or fluid in the presacral soft tissues. Small
amount of ascites in the upper abdomen and slightly increased from
the previous examination.

Musculoskeletal: Anterolisthesis at L4-L5 appears to be secondary to
facet arthropathy. Subcutaneous edema.
IMPRESSION: Mild-to-moderate right hydronephrosis despite having a percutaneous
nephrostomy tube and ureteral stent. Recommend confirming that these
tubes are functioning. This could be performed with a right
nephrostogram.

Postsurgical changes compatible with a colostomy and Hartman's
pouch. Few dilated loops of small bowel in the left abdomen and
cannot exclude bowel wall thickening in this area. Findings are
poorly characterized on this examination but cannot exclude small
bowel enteritis in this area.

Slightly increased abdominal ascites.

Stable splenomegaly.

Cholelithiasis.

Right kidney stone.

Chronic atrophy of the left kidney.

## 2017-03-18 IMAGING — XA IR NEPHROSTOGRAM EXISTING ACCESS RIGHT
1 series · 7 of 7 positions shown · non-contrast
Comparison: CT abdomen pelvis - 12/19/2016

INDICATION: Questionable malfunctioning right-sided percutaneous nephrostomy
catheter and ureteral stent seen on preceding abdominal CT.

Please perform right-sided antegrade nephrostogram for further
evaluation.
EXAM:
FLUOROSCOPIC GUIDED RIGHT-SIDED ANTEGRADE NEPHROSTOGRAM.

[Series 300: dr. (person_name) · 7 of 7 slices shown]
[im 1/7]
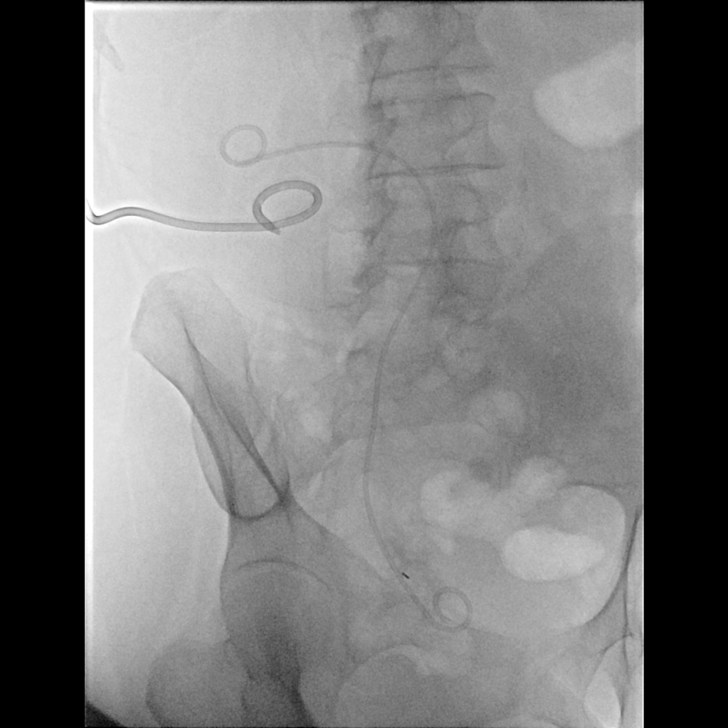
[im 2/7]
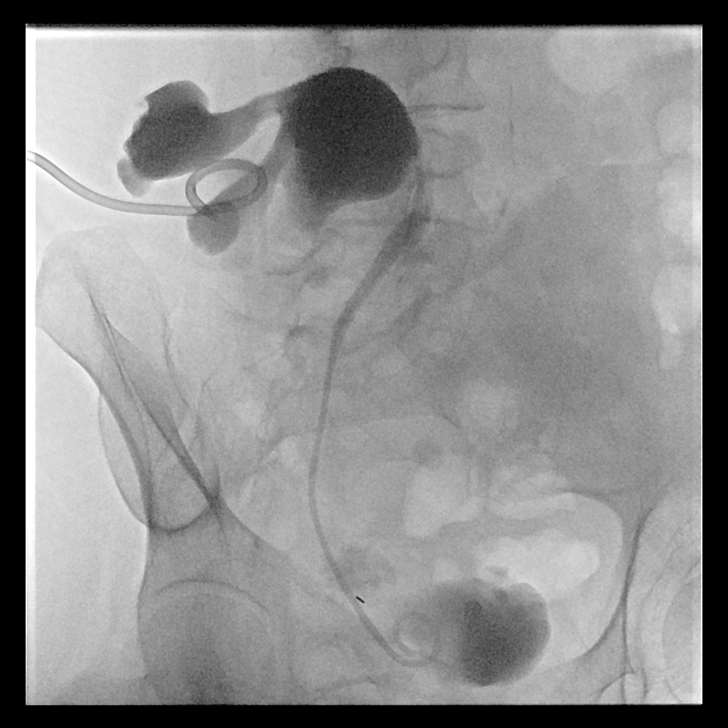
[im 3/7]
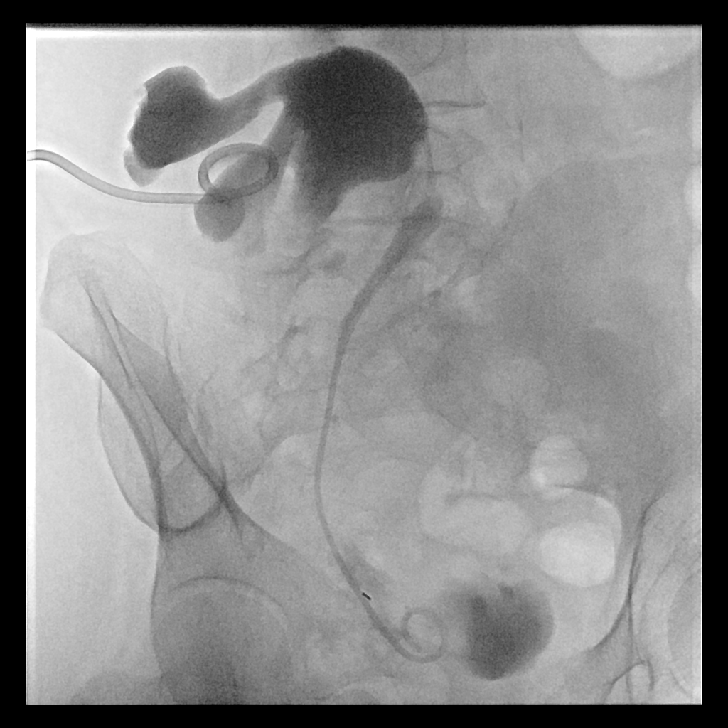
[im 4/7]
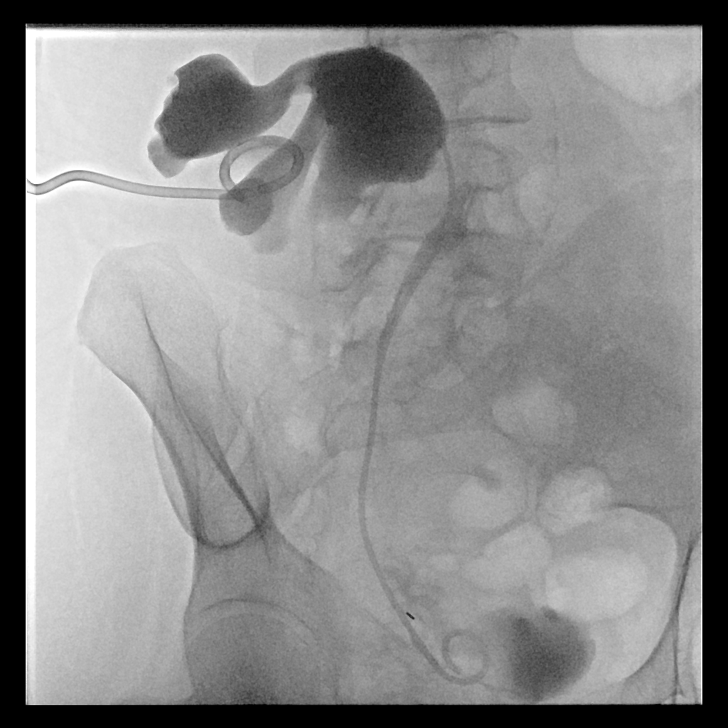
[im 5/7]
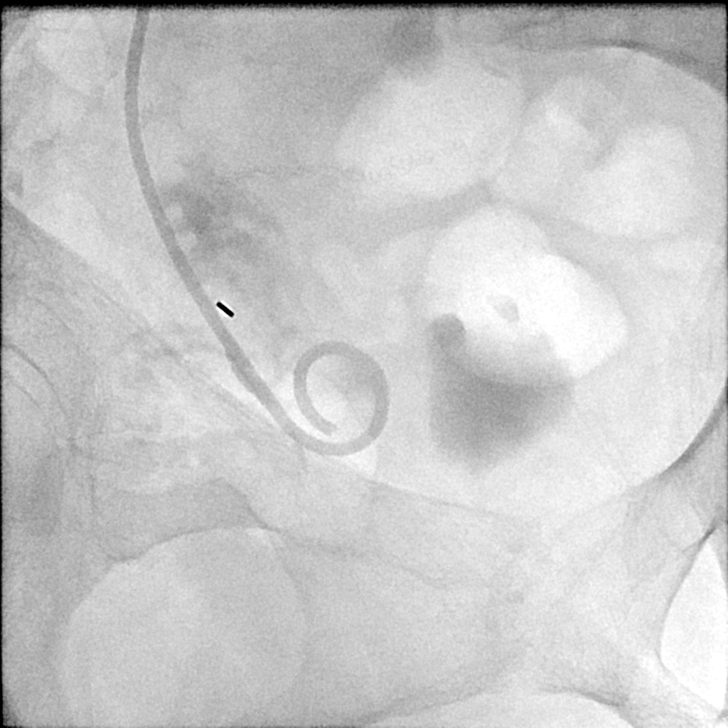
[im 6/7]
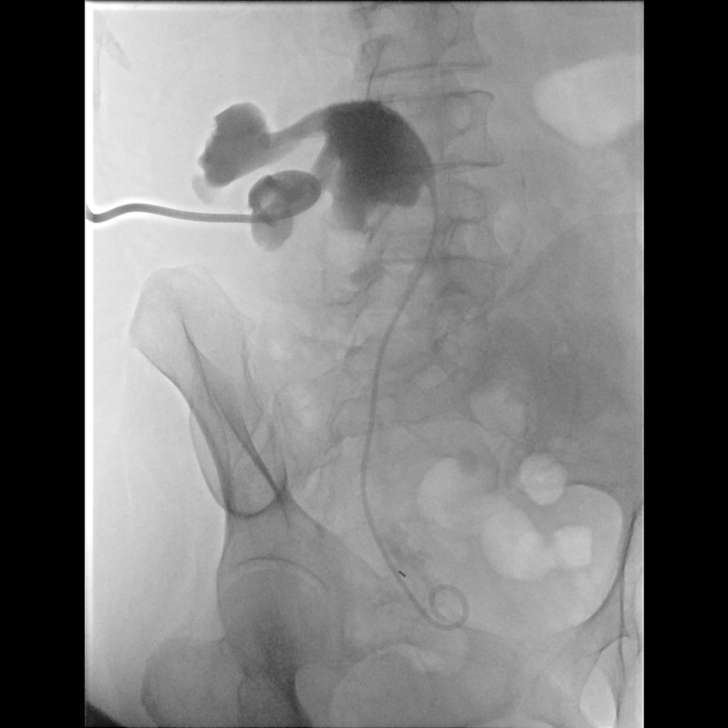
[im 7/7]
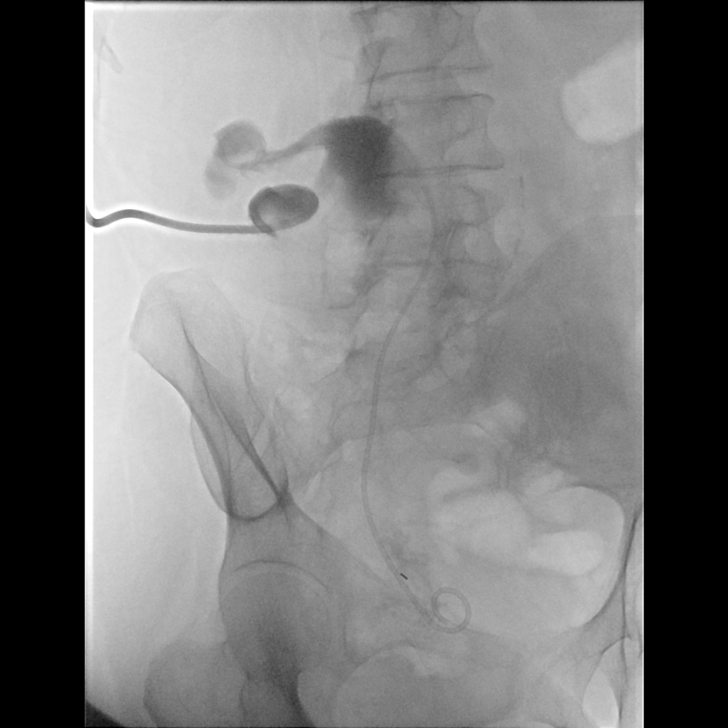

[7 of 7 positions shown; findings below may reference images not displayed]

MEDICATIONS:
None

ANESTHESIA/SEDATION:
None

CONTRAST:  20 cc [id number] - administered into the right renal
collecting system(s)

FLUOROSCOPY TIME:  Fluoroscopy Time: 1 minute 12 seconds (46.9 mGy).

COMPLICATIONS:
None immediate.

PROCEDURE:
The patient was positioned supine on the fluoroscopy table.

A preprocedural spot fluoroscopic image was obtained of the existing
right-sided percutaneous nephrostomy catheter and the right-sided
double-J ureteral stent.

Multiple spot fluoroscopic images were obtained following the
injection of a small amount of contrast via the existing right-sided
percutaneous nephrostomy catheter. Images reviewed in the procedure
was terminated. The right-sided nephrostomy was flushed with a small
amount of saline and reconnected to a gravity bag.
FINDINGS: Preprocedural spot fluoroscopic image demonstrates unchanged
positioning of right-sided percutaneous nephrostomy catheter and
double-J oral stent.

Contrast injection confirms appropriate positioning of the
nephrostomy catheter with end coiled and locked within a mid pole
calyx. There is brisk opacification of the right renal collecting
system.

The cranial aspect the right-sided double-J ureteral stent is
positioned within a right superior pole calyx.

There is passage of contrast both through and around the right-sided
double-J ureteral stent to the level of the urinary bladder.

Note is made of mild right-sided pelvicaliectasis though this this
potentially could be chronic in etiology in the setting of a chronic
right-sided UPJ obstruction.
IMPRESSION: 1. Appropriately positioned and functioning right-sided percutaneous
nephrostomy catheter and double-J ureteral stent. No exchange
performed.
2. Mild right-sided pelvicaliectasis as demonstrated on preceding
abdominal CT - this finding is nonspecific and of doubtful clinical
significance, potentially chronic in etiology in the setting of a
right-sided UPJ obstruction.

## 2017-12-15 DEATH — deceased
